# Patient Record
Sex: Female | Born: 1985 | Race: Black or African American | Hispanic: No | Marital: Single
Health system: Southern US, Academic
[De-identification: ages and names within clinical notes are randomized; demographics above are authoritative.]

## PROBLEM LIST (undated history)

## (undated) ENCOUNTER — Ambulatory Visit: Payer: MEDICAID

## (undated) ENCOUNTER — Encounter: Attending: Medical Oncology | Primary: Medical Oncology

## (undated) ENCOUNTER — Telehealth

## (undated) ENCOUNTER — Ambulatory Visit: Payer: PRIVATE HEALTH INSURANCE

## (undated) ENCOUNTER — Encounter

## (undated) ENCOUNTER — Encounter: Attending: Geriatric Medicine | Primary: Geriatric Medicine

## (undated) ENCOUNTER — Ambulatory Visit

## (undated) ENCOUNTER — Telehealth: Attending: Pharmacist | Primary: Pharmacist

## (undated) ENCOUNTER — Telehealth: Attending: Medical Oncology | Primary: Medical Oncology

## (undated) ENCOUNTER — Telehealth: Attending: Adult Health | Primary: Adult Health

## (undated) ENCOUNTER — Encounter: Attending: Adult Health | Primary: Adult Health

## (undated) ENCOUNTER — Ambulatory Visit: Payer: PRIVATE HEALTH INSURANCE | Attending: Medical Oncology | Primary: Medical Oncology

## (undated) ENCOUNTER — Ambulatory Visit: Attending: Adult Health | Primary: Adult Health

## (undated) ENCOUNTER — Inpatient Hospital Stay

## (undated) ENCOUNTER — Encounter: Attending: Oncology | Primary: Oncology

## (undated) ENCOUNTER — Ambulatory Visit: Payer: PRIVATE HEALTH INSURANCE | Attending: Radiation Oncology | Primary: Radiation Oncology

## (undated) ENCOUNTER — Encounter: Attending: Pharmacist | Primary: Pharmacist

## (undated) ENCOUNTER — Ambulatory Visit: Attending: Radiation Oncology | Primary: Radiation Oncology

## (undated) ENCOUNTER — Ambulatory Visit: Payer: MEDICAID | Attending: Medical Oncology | Primary: Medical Oncology

## (undated) ENCOUNTER — Encounter: Attending: MS" | Primary: MS"

## (undated) ENCOUNTER — Ambulatory Visit: Payer: PRIVATE HEALTH INSURANCE | Attending: Family | Primary: Family

## (undated) ENCOUNTER — Ambulatory Visit: Payer: PRIVATE HEALTH INSURANCE | Attending: Critical Care Medicine | Primary: Critical Care Medicine

## (undated) ENCOUNTER — Telehealth: Attending: Critical Care Medicine | Primary: Critical Care Medicine

## (undated) DIAGNOSIS — C50211 Malignant neoplasm of upper-inner quadrant of right female breast: Secondary | ICD-10-CM

## (undated) DIAGNOSIS — Q819 Epidermolysis bullosa, unspecified: Secondary | ICD-10-CM

## (undated) DIAGNOSIS — C50919 Malignant neoplasm of unspecified site of unspecified female breast: Secondary | ICD-10-CM

## (undated) HISTORY — DX: Malignant neoplasm of upper-inner quadrant of right female breast: C50.211

## (undated) HISTORY — DX: Malignant neoplasm of unspecified site of unspecified female breast: C50.919

---
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
## 2012-06-14 ENCOUNTER — Emergency Department (HOSPITAL_COMMUNITY)
Admission: EM | Admit: 2012-06-14 | Discharge: 2012-06-14 | Disposition: A | Discharge status: Home / Self Care | Payer: Self-pay | Attending: Emergency Medicine | Admitting: Emergency Medicine

## 2012-06-14 ENCOUNTER — Encounter (HOSPITAL_COMMUNITY): Payer: Self-pay | Admitting: *Deleted

## 2012-06-14 DIAGNOSIS — H109 Unspecified conjunctivitis: Secondary | ICD-10-CM | POA: Insufficient documentation

## 2012-06-14 DIAGNOSIS — H5789 Other specified disorders of eye and adnexa: Secondary | ICD-10-CM | POA: Insufficient documentation

## 2012-06-14 DIAGNOSIS — Z87798 Personal history of other (corrected) congenital malformations: Secondary | ICD-10-CM | POA: Insufficient documentation

## 2012-06-14 HISTORY — DX: Epidermolysis bullosa, unspecified: Q81.9

## 2012-06-14 MED ORDER — ERYTHROMYCIN 5 MG/GM OP OINT
1.0000 "application " | TOPICAL_OINTMENT | Freq: Once | OPHTHALMIC | Status: AC
  Administered 2012-06-14: 1 via OPHTHALMIC
  Filled 2012-06-14: qty 3.5

## 2012-06-14 NOTE — ED Provider Notes (Signed)
History     CSN: [id number]  Arrival date & time 06/14/12  0157   First MD Initiated Contact with Patient 06/14/12 [phone]      Chief Complaint  Patient presents with  . eye irritation     (Consider location/radiation/quality/duration/timing/severity/associated sxs/prior treatment) HPI  Marisa Hudson Date is a 27 y.o. female complaining of irritation, redness and swelling to lower eyelid on left eye onset yesterday. Patient denies eye pain, decreased vision, trauma to the affected eye. 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 She has woken up with the eyelid crusted shut this a.m. She does not use contact lenses   Past Medical History  Diagnosis Date  . Epidermolysis bullosa     History reviewed. No pertinent past surgical history.  No family history on file.  History  Substance Use Topics  . Smoking status: Never Smoker   . Smokeless tobacco: Not on file  . Alcohol Use: No    OB History   Grav Para Term Preterm Abortions TAB SAB Ect Mult Living                  Review of Systems  Constitutional: Negative for fever.  Eyes: Positive for redness. Negative for photophobia, pain and visual disturbance.  Respiratory: Negative for shortness of breath.   Cardiovascular: Negative for chest pain.  Gastrointestinal: Negative for nausea, vomiting, abdominal pain and diarrhea.  All other systems reviewed and are negative.    Allergies  Review of patient's allergies indicates no known allergies.  Home Medications  No current outpatient prescriptions on file.  BP 145/90  Pulse 75  Temp(Src) 98.4 F (36.9 C) (Oral)  Resp 16  SpO2 100%  LMP 06/09/2012  Physical Exam  Nursing note and vitals reviewed. Constitutional: She is oriented to person, place, and time. She appears well-developed and well-nourished. No distress.  HENT:  Head: Normocephalic.  Mouth/Throat: Oropharynx is clear and moist.  Eyes: EOM are normal. Pupils are equal,  round, and reactive to light.  Injection to right bulbar conjunctiva. Mild swelling to lower lid, purulent discharge.  Neck: Normal range of motion.  Cardiovascular: Normal rate, regular rhythm and intact distal pulses.   Pulmonary/Chest: Effort normal and breath sounds normal. No stridor. No respiratory distress. She has no wheezes. She has no rales. She exhibits no tenderness.  Abdominal: Soft. Bowel sounds are normal. She exhibits no distension and no mass. There is no tenderness. There is no rebound and no guarding.  Musculoskeletal: Normal range of motion.  Lymphadenopathy:    She has no cervical adenopathy.  Neurological: She is alert and oriented to person, place, and time.  Psychiatric: She has a normal mood and affect.    ED Course  Procedures (including critical care time)  Labs Reviewed - No data to display No results found.   1. Conjunctivitis       MDM   Filed Vitals:   06/14/12 0204  BP: 145/90  Pulse: 75  Temp: 98.4 F (36.9 C)  TempSrc: Oral  Resp: 16  SpO2: 100%     Marisa Hudson is a 27 y.o. female infection consistent with a bacterial conjunctivitis. She will be given erythromycin ointment.  Medications  erythromycin ophthalmic ointment 1 application (1 application Right Eye Given 06/14/12 0252)    The patient is hemodynamically stable, appropriate for, and amenable to, discharge at this time. Pt verbalized understanding and agrees with care plan. Outpatient follow-up and return precautions given.  Wynetta Emery, PA-C 06/14/12 [phone]

## 2012-06-14 NOTE — ED Notes (Signed)
Pt states has a skin condition that flares up; states yesterday eye became irritated/red/running; denies pain; states got worse when outside

## 2012-06-15 NOTE — ED Provider Notes (Signed)
Medical screening examination/treatment/procedure(s) were performed by non-physician practitioner and as supervising physician I was immediately available for consultation/collaboration.  Sunnie Nielsen, MD 06/15/12 [phone]

## 2015-02-23 ENCOUNTER — Ambulatory Visit (INDEPENDENT_AMBULATORY_CARE_PROVIDER_SITE_OTHER): Payer: Self-pay | Admitting: Family Medicine

## 2015-02-23 VITALS — BP 138/90 | HR 85 | Temp 98.3°F | Resp 20 | Ht 66.0 in | Wt 212.6 lb

## 2015-02-23 DIAGNOSIS — N6310 Unspecified lump in the right breast, unspecified quadrant: Secondary | ICD-10-CM

## 2015-02-23 DIAGNOSIS — N6452 Nipple discharge: Secondary | ICD-10-CM

## 2015-02-23 DIAGNOSIS — N63 Unspecified lump in breast: Secondary | ICD-10-CM

## 2015-02-23 NOTE — Patient Instructions (Signed)
We will schedule a ultrasound and mammogram as discussed. If you do have increased discomfort, fevers or chills, or other symptoms prior to that ultrasound, return as may need to consider antibiotics.  Once I have the results of the studies, we will let you know the next step.

## 2015-02-23 NOTE — Progress Notes (Signed)
Subjective:  By signing my name below, I, [name], attest that this documentation has been prepared under the direction and in the presence of [name], MD.  [name], Medical Scribe. 02/23/2015.  8:47 AM.   Patient ID: Marisa Hudson, female    DOB: 02/19/1985, 30 y.o.   MRN: [id number]  Chief Complaint  Patient presents with  . Breast Mass    right breast    HPI HPI Comments: Marisa Hudson is a 30 y.o. female who presents to [facility] complaining of right breast mass, first noticed 5 days ago.  Pt reports noticing blood on her T-shirt after itching the breast area. She notes that after she checked the area she noticed discharge from the right nipple, and presence of blisters on the surrounding area which she notes is not abnormal for her. In addition to that, pt noted a mass that is palpated under the breast tissue. She reports no associated tenderness of the area. Pt states having a history of Epidermolysis bullosa that usually presents in blisters, she denies associated pain with it. She does not follow up with a dermatologist for this. Pt reports no history of having a mammogram. She denies previous history of breast lumps or tumors, chills, fevers, unexpected weight loss, menstrual problems. Pt is not sexual active and denies recent pregnancies.   Pt states that her new medical insurance will not start until March 1st.   There are no active problems to display for this patient.  Past Medical History  Diagnosis Date  . Epidermolysis bullosa    No past surgical history on file. No Known Allergies Prior to Admission medications   Not on File   Social History   Social History  . Marital Status: Single    Spouse Name: N/A  . Number of Children: N/A  . Years of Education: N/A   Occupational History  . Not on file.   Social History Main Topics  . Smoking status: Never Smoker   . Smokeless tobacco: Not on file  . Alcohol  Use: No  . Drug Use: Not on file  . Sexual Activity: Not on file   Other Topics Concern  . Not on file   Social History Narrative    Review of Systems  Constitutional: Negative for fever, chills and unexpected weight change.  Genitourinary: Negative for menstrual problem.  Musculoskeletal:       Mass under the right breast tissue.      Objective:   Physical Exam  Constitutional: She is oriented to person, place, and time. She appears well-developed and well-nourished. No distress.  HENT:  Head: Normocephalic and atraumatic.  Eyes: EOM are normal. Pupils are equal, round, and reactive to light.  Neck: Neck supple.  Cardiovascular: Normal rate.   Pulmonary/Chest: Effort normal.  Musculoskeletal:  Healed scars on the upper breast on the right side. Small amount of discharge from the nipple that appears to be clear-yellow in color. On the medial aspect of the right breast at approximately  3 o'clock, there is a firm rounded mass on a firm rounded area that measures approximately 4-5 cm across. Non tender. No other masses palpated. Left breast is normal. No left niple discharge.   Neurological: She is alert and oriented to person, place, and time. No cranial nerve deficit.  Skin: Skin is warm and dry.  Psychiatric: She has a normal mood and affect. Her behavior is normal.  Nursing note and vitals reviewed.  Filed Vitals:   02/23/15 0829  BP: 138/90  Pulse: 85  Temp: 98.3 F (36.8 C)  TempSrc: Oral  Resp: 20  Height: 5\' 6"  (1.676 m)  Weight: 212 lb 9.6 oz (96.435 kg)  SpO2: 94%      Assessment & Plan:   Marisa Hudson is a 31 y.o. female Bloody discharge from right nipple - Plan: US BREAST LTD UNI RIGHT INC AXILLA, MM Digital Diagnostic Bilat  Mass of right breast - Plan: US BREAST LTD UNI RIGHT INC AXILLA, MM Digital Diagnostic Bilat   -New-onset of right breast mass with nipple discharge, bleeding. History of epidermolysis bullosa, but no other new skin changes  seen around the nipple. Less likely Paget's. Possible cyst versus mass. We'll check a diagnostic ultrasound and mammogram, then determine next step. RTC precautions of fever, pain or other worsening symptoms. Deferred antibiotics at this time as nontender and no signs of infection.  No orders of the defined types were placed in this encounter.   Patient Instructions  We will schedule a ultrasound and mammogram as discussed. If you do have increased discomfort, fevers or chills, or other symptoms prior to that ultrasound, return as may need to consider antibiotics.  Once I have the results of the studies, we will let you know the next step.      I personally performed the services described in this documentation, which was scribed in my presence. The recorded information has been reviewed and considered, and addended by me as needed.

## 2015-02-24 ENCOUNTER — Other Ambulatory Visit: Payer: Self-pay | Admitting: Family Medicine

## 2015-02-24 DIAGNOSIS — N6452 Nipple discharge: Secondary | ICD-10-CM

## 2015-02-24 DIAGNOSIS — N63 Unspecified lump in unspecified breast: Secondary | ICD-10-CM

## 2015-03-03 ENCOUNTER — Ambulatory Visit
Admission: RE | Admit: 2015-03-03 | Discharge: 2015-03-03 | Disposition: A | Discharge status: Home / Self Care | Payer: BLUE CROSS/BLUE SHIELD | Source: Ambulatory Visit | Attending: Family Medicine | Admitting: Family Medicine

## 2015-03-03 ENCOUNTER — Other Ambulatory Visit: Payer: Self-pay | Admitting: Family Medicine

## 2015-03-03 DIAGNOSIS — N6310 Unspecified lump in the right breast, unspecified quadrant: Secondary | ICD-10-CM

## 2015-03-03 DIAGNOSIS — N6452 Nipple discharge: Secondary | ICD-10-CM

## 2015-03-03 DIAGNOSIS — N63 Unspecified lump in unspecified breast: Secondary | ICD-10-CM

## 2015-03-05 ENCOUNTER — Telehealth: Payer: Self-pay | Admitting: *Deleted

## 2015-03-05 ENCOUNTER — Encounter: Payer: Self-pay | Admitting: *Deleted

## 2015-03-05 DIAGNOSIS — C50211 Malignant neoplasm of upper-inner quadrant of right female breast: Secondary | ICD-10-CM

## 2015-03-05 HISTORY — DX: Malignant neoplasm of upper-inner quadrant of right female breast: C50.211

## 2015-03-05 NOTE — Telephone Encounter (Signed)
Confirmed [facility] for 03/10/15 at 1215 .  Instructions and contact information given.

## 2015-03-05 NOTE — Telephone Encounter (Signed)
Left vm for pt to return call regarding Marisa Hudson appt on 03/10/15. Contact information provided.

## 2015-03-08 ENCOUNTER — Telehealth: Payer: Self-pay | Admitting: *Deleted

## 2015-03-08 NOTE — Telephone Encounter (Signed)
Mailed clinic packet to pt.  

## 2015-03-10 ENCOUNTER — Ambulatory Visit
Admission: RE | Admit: 2015-03-10 | Discharge: 2015-03-10 | Disposition: A | Discharge status: Home / Self Care | Payer: BLUE CROSS/BLUE SHIELD | Source: Ambulatory Visit | Attending: Radiation Oncology | Admitting: Radiation Oncology

## 2015-03-10 ENCOUNTER — Telehealth: Payer: Self-pay | Admitting: Hematology and Oncology

## 2015-03-10 ENCOUNTER — Encounter: Payer: Self-pay | Admitting: Physical Therapy

## 2015-03-10 ENCOUNTER — Encounter: Payer: Self-pay | Admitting: Nurse Practitioner

## 2015-03-10 ENCOUNTER — Ambulatory Visit: Payer: BLUE CROSS/BLUE SHIELD | Attending: General Surgery | Admitting: Physical Therapy

## 2015-03-10 ENCOUNTER — Other Ambulatory Visit: Payer: Self-pay | Admitting: *Deleted

## 2015-03-10 ENCOUNTER — Encounter: Payer: Self-pay | Admitting: Hematology and Oncology

## 2015-03-10 ENCOUNTER — Ambulatory Visit (HOSPITAL_BASED_OUTPATIENT_CLINIC_OR_DEPARTMENT_OTHER): Payer: BLUE CROSS/BLUE SHIELD | Admitting: Hematology and Oncology

## 2015-03-10 ENCOUNTER — Encounter: Payer: Self-pay | Admitting: Genetic Counselor

## 2015-03-10 ENCOUNTER — Other Ambulatory Visit (HOSPITAL_BASED_OUTPATIENT_CLINIC_OR_DEPARTMENT_OTHER): Payer: BLUE CROSS/BLUE SHIELD

## 2015-03-10 VITALS — BP 147/87 | HR 95 | Temp 98.9°F | Resp 18 | Ht 66.0 in | Wt 205.7 lb

## 2015-03-10 DIAGNOSIS — C50211 Malignant neoplasm of upper-inner quadrant of right female breast: Secondary | ICD-10-CM

## 2015-03-10 DIAGNOSIS — Z8041 Family history of malignant neoplasm of ovary: Secondary | ICD-10-CM

## 2015-03-10 DIAGNOSIS — C773 Secondary and unspecified malignant neoplasm of axilla and upper limb lymph nodes: Secondary | ICD-10-CM | POA: Diagnosis not present

## 2015-03-10 DIAGNOSIS — Q819 Epidermolysis bullosa, unspecified: Secondary | ICD-10-CM | POA: Diagnosis not present

## 2015-03-10 DIAGNOSIS — R293 Abnormal posture: Secondary | ICD-10-CM | POA: Diagnosis present

## 2015-03-10 LAB — COMPREHENSIVE METABOLIC PANEL
ALBUMIN: 3.9 g/dL (ref 3.5–5.0)
ALK PHOS: 61 U/L (ref 40–150)
ALT: 12 U/L (ref 0–55)
AST: 16 U/L (ref 5–34)
Anion Gap: 9 mEq/L (ref 3–11)
BILIRUBIN TOTAL: 0.43 mg/dL (ref 0.20–1.20)
BUN: 6.5 mg/dL — ABNORMAL LOW (ref 7.0–26.0)
CALCIUM: 9.1 mg/dL (ref 8.4–10.4)
CO2: 24 mEq/L (ref 22–29)
Chloride: 107 mEq/L (ref 98–109)
Creatinine: 0.9 mg/dL (ref 0.6–1.1)
GLUCOSE: 99 mg/dL (ref 70–140)
POTASSIUM: 3.9 meq/L (ref 3.5–5.1)
Sodium: 139 mEq/L (ref 136–145)
TOTAL PROTEIN: 7.4 g/dL (ref 6.4–8.3)

## 2015-03-10 LAB — CBC WITH DIFFERENTIAL/PLATELET
BASO%: 0.5 % (ref 0.0–2.0)
BASOS ABS: 0 10*3/uL (ref 0.0–0.1)
EOS ABS: 0 10*3/uL (ref 0.0–0.5)
EOS%: 0.9 % (ref 0.0–7.0)
HEMATOCRIT: 38.2 % (ref 34.8–46.6)
HEMOGLOBIN: 11.7 g/dL (ref 11.6–15.9)
LYMPH#: 1.7 10*3/uL (ref 0.9–3.3)
LYMPH%: 31.9 % (ref 14.0–49.7)
MCH: 21.9 pg — AB (ref 25.1–34.0)
MCHC: 30.6 g/dL — ABNORMAL LOW (ref 31.5–36.0)
MCV: 71.6 fL — AB (ref 79.5–101.0)
MONO#: 0.5 10*3/uL (ref 0.1–0.9)
MONO%: 9.5 % (ref 0.0–14.0)
NEUT%: 57.2 % (ref 38.4–76.8)
NEUTROS ABS: 3 10*3/uL (ref 1.5–6.5)
PLATELETS: 303 10*3/uL (ref 145–400)
RBC: 5.34 10*6/uL (ref 3.70–5.45)
RDW: 13.5 % (ref 11.2–14.5)
WBC: 5.2 10*3/uL (ref 3.9–10.3)

## 2015-03-10 NOTE — Assessment & Plan Note (Signed)
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 After reviewing the pathology in detail, we proceeded to discuss the different treatment options between surgery, radiation, chemotherapy.  Recommendation: 1. Neoadjuvant chemotherapy with dose dense Adriamycin and Cytoxan 4 followed by Abraxane and carboplatin weekly 12 2. Followed by breast conserving surgery and sentinel lymph node biopsy versus axillary lymph node dissection 3. We may consider her for Alliance clinical trial that randomizes to axillary lymph node dissection versus radiation alone 4. Adjuvant radiation therapy   Chemotherapy Counseling: I discussed the risks and benefits of chemotherapy including the risks of nausea/ vomiting, risk of infection from low WBC count, fatigue due to chemo or anemia, bruising or bleeding due to low platelets, mouth sores, loss/ change in taste and decreased appetite. Liver and kidney function will be monitored through out chemotherapy as abnormalities in liver and kidney function may be a side effect of treatment. Cardiac dysfunction due to Adriamycin was discussed in detail. Risk of permanent bone marrow dysfunction and leukemia due to chemo were also discussed.  Plan: 1. MRI of the breast 2. Echocardiogram 3. Port placement 4. Chemotherapy class  Epidermolysis bullosa: We will refer the patient to dermatology to work with [place]  during chemotherapy and later for her extensive skin disease that she has had throughout her life.  Patient would like to have a second opinion regarding the treatment plan as was the diagnosis. I recommended that she see Dr. Wetzel Bjornstad at [facility] for second opinion.  We will plan on chemotherapy once she comes back from her second opinion and makes up her mind regarding chemotherapy.

## 2015-03-10 NOTE — Progress Notes (Signed)
Marisa Hudson is a very pleasant 30 y.o. female from [place], [place] with newly diagnosed invasive ductal carcinoma of the right breast.  Biopsy results revealed the tumor's prognostic profile is ER negative, PR negative, and HER2/neu negative.  She presents today with her mother and her two friends to the [facility]) for treatment consideration and recommendations from the breast surgeon, radiation oncologist, and medical oncologist.     I briefly met with Marisa Hudson and her mother and her two friends during her [facility] visit today. We discussed the purpose of the Survivorship Clinic, which will include monitoring for recurrence, coordinating completion of age and gender-appropriate cancer screenings, promotion of overall wellness, as well as managing potential late/long-term side effects of anti-cancer treatments.    The treatment plan for Marisa Hudson will likely include surgery, chemotherapy, and radiation therapy.  She will meet with the Genetics Counselor due to her age and triple negative status. As of today, the intent of treatment for Marisa Hudson is cure, therefore she will be eligible for the Survivorship Clinic upon her completion of treatment.  Her survivorship care plan (SCP) document will be drafted and updated throughout the course of her treatment trajectory. She will receive the SCP in an office visit with myself in the Survivorship Clinic once she has completed treatment.   Marisa Hudson was encouraged to ask questions and all questions were answered to her satisfaction.  She was given my business card and encouraged to contact me with any concerns regarding survivorship.  I look forward to participating in her care.   Kenn File, [place] [phone]

## 2015-03-10 NOTE — Progress Notes (Signed)
  [place] Psychosocial Distress Screening Clinical Social Work  Clinical Social Work was referred by distress screening protocol.  The patient scored a 2 on the Psychosocial Distress Thermometer which indicates Mild distress. Counseling Intern to assess for distress and other psychosocial needs.   ONCBCN DISTRESS SCREENING 03/10/2015  Screening Type Initial Screening  Distress experienced in past week (1-10) 2  Information Concerns Type Lack of info about diagnosis  Referral to support programs Yes   Counseling intern note: Met with Pt, mother, and 2 friends during [facility]. Pt reported a low level of distress and stated that her faith, family and friends keep her feeling optimistic. Pt stated that she wishes her care team knew more about her skin condition (lack of info about her dx is regarding her tx team not knowing about her skin condition).  Pt stated that clinic was helpful and very informative.  Client stressed that her faith is very important to her.  Counseling Intern provided information about supportive services available in the support center.  Client stated that she would reach out as needed.  No support center follow up indicated at this time.  -Vaughan Sine Counseling Intern  [phone]

## 2015-03-10 NOTE — Therapy (Signed)
[place], [place], [address] Phone: [phone]   Fax:  [phone]  Physical Therapy Evaluation  Patient Details  Name: Marisa Hudson MRN: [id number] Date of Birth: 01-10-1985 Referring Provider: Dr. Rolm Bookbinder  Encounter Date: 03/10/2015      PT End of Session - 03/10/15 1557    Visit Number 1   Number of Visits 1   PT Start Time 1430   PT Stop Time 1444  Also saw pt from 1455-1512 for a total of 31 minutes   PT Time Calculation (min) 14 min   Activity Tolerance Patient tolerated treatment well   Behavior During Therapy [facility] for tasks assessed/performed      Past Medical History  Diagnosis Date  . Epidermolysis bullosa   . Breast cancer of upper-inner quadrant of right female breast ([place]) 03/05/2015  . Breast cancer ([place])     History reviewed. No pertinent past surgical history.  There were no vitals filed for this visit.  Visit Diagnosis:  Carcinoma of right breast upper inner quadrant ([place]) - Plan: PT plan of care cert/re-cert  Abnormal posture - Plan: PT plan of care cert/re-cert      Subjective Assessment - 03/10/15 1532    Subjective Patient reports she was diagnosed with breast cancer.  She reports she has a skin condition that effects her inside and outside her body and questions whether this diagnosis of breast cancer is actually breast cancer or possibly something else related to her skin condition.  She is seeking a second opinion at [facility].  She was seen today for a baseline assessment of her newly diagnosed breast cancer.   Pertinent History Patient was diagnosed on 03/03/15 with right triple negative grade 3 breast cancer.  It has a Ki67 of 80%, measures 4.4 cm, is located in the upper inner quadrant, and has a biopsy proven positive lymph node.  She reports she has a skin condition that effects her inside and outside her body and questions whether this diagnosis  of breast cancer is actually breast cancer or possibly something else related to her skin condition.  She is seeking a second opinion at [facility].     Patient Stated Goals Reduce lymphedema risk and learn post op shoulder ROM HEP "If I actually have breast cancer and have surgery"   Currently in Pain? No/denies            [facility] PT Assessment - 03/10/15 0001    Assessment   Medical Diagnosis Right breast cancer; Epidermolysis bullosa   Referring Provider Dr. Rolm Bookbinder   Onset Date/Surgical Date 03/03/15   Hand Dominance Left   Prior Therapy none   Precautions   Precautions Other (comment)   Precaution Comments Active breast cancer;    Restrictions   Weight Bearing Restrictions No   Balance Screen   Has the patient fallen in the past 6 months No   Has the patient had a decrease in activity level because of a fear of falling?  No   Is the patient reluctant to leave their home because of a fear of falling?  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   Leisure She does not exercise   Cognition   Overall Cognitive Status Within Functional Limits for tasks assessed   Posture/Postural Control   Posture/Postural Control Postural limitations   Postural Limitations Rounded Shoulders;Forward head   ROM / Strength   AROM / PROM / Strength AROM;Strength   AROM   AROM Assessment Site Shoulder   Right/Left Shoulder Right;Left   Right Shoulder Extension 55 Degrees   Right Shoulder Flexion 153 Degrees   Right Shoulder ABduction 141 Degrees   Right Shoulder Internal Rotation 80 Degrees   Right Shoulder External Rotation 79 Degrees   Left Shoulder Extension 69 Degrees    Left Shoulder Flexion 161 Degrees   Left Shoulder ABduction 151 Degrees   Left Shoulder Internal Rotation 80 Degrees   Left Shoulder External Rotation 86 Degrees   Strength   Overall Strength Within functional limits for tasks performed           LYMPHEDEMA/ONCOLOGY QUESTIONNAIRE - 03/10/15 1555    Type   Cancer Type Right breast cancer   Lymphedema Assessments   Lymphedema Assessments Upper extremities   Right Upper Extremity Lymphedema   10 cm Proximal to Olecranon Process 33.9 cm   Olecranon Process 26.5 cm   10 cm Proximal to Ulnar Styloid Process 23.2 cm   Just Proximal to Ulnar Styloid Process 15.5 cm   Across Hand at [employer] 18.7 cm   At [place] of 2nd Digit 6.1 cm   Left Upper Extremity Lymphedema   10 cm Proximal to Olecranon Process 34.3 cm   Olecranon Process 26.4 cm   10 cm Proximal to Ulnar Styloid Process 22.7 cm   Just Proximal to Ulnar Styloid Process 15.8 cm   Across Hand at [employer] 19.1 cm   At [place] of 2nd Digit 6 cm        Patient was instructed today in a home exercise program today for post op shoulder range of motion. These included active assist shoulder flexion in sitting, scapular retraction, wall walking with shoulder abduction, and hands behind head external rotation.  She was encouraged to do these twice a day, holding 3 seconds and repeating 5 times when permitted by her physician.         PT Education - 03/10/15 1557    Education provided Yes   Education Details Lymphedema risk reduction and post op shoulder ROM HEP   Person(s) Educated Patient;Caregiver(s);Parent(s)   Methods Explanation;Demonstration;Handout   Comprehension Returned demonstration;Verbalized understanding              Breast Clinic Goals - 03/10/15 1601    Patient will be able to verbalize understanding of pertinent lymphedema risk reduction practices relevant to her diagnosis specifically related to skin care.   Time 1   Period Days   Status  Achieved   Patient will be able to return demonstrate and/or verbalize understanding of the post-op home exercise program related to regaining shoulder range of motion.   Time 1   Period Days   Status Achieved   Patient will be able to verbalize understanding of the importance of attending the postoperative After Breast Cancer Class for further lymphedema risk reduction education and therapeutic exercise.   Time 1   Period Days   Status Achieved              Plan - 03/10/15 1558    Clinical Impression Statement Patient was diagnosed on 03/03/15 with right triple negative grade 3 breast cancer.  It has a Ki67 of 80%, measures 4.4 cm, is located in the upper inner quadrant, and has a biopsy proven positive lymph node.  She reports she has a skin condition that effects her inside and outside her body and questions whether this diagnosis of breast cancer is actually breast cancer or possibly something else related to her skin condition.  She is seeking a second opinion at [facility].  Her recommended treatment plan is neoadjuvant chemotherapy followed by a right lumpectomy or mastectomy with either a sentinel node biopsy or axillary lymph node dissection followed by radiation.  She will benefit from PT after surgery to regain shoulder ROM and reduce lymphedema risk as she will likely be at high risk.    Pt will benefit from skilled therapeutic intervention in order to improve on the following deficits Decreased strength;Pain;Decreased knowledge of precautions;Impaired UE functional use;Decreased range of motion   Rehab Potential Excellent   Clinical Impairments Affecting Rehab Potential none   PT Frequency One time visit   PT Treatment/Interventions Therapeutic exercise;Patient/family education   PT Next Visit Plan Will f/u after surgery if pt consents to surgery   PT Home Exercise Plan Post op shoulder ROM HEP   Consulted and Agree with Plan of Care Patient;Family member/caregiver    Family Member Consulted Mother       Patient will follow up at outpatient cancer rehab if needed following surgery.  If the patient requires physical therapy at that time, a specific plan will be dictated and sent to the referring physician for approval. The patient was educated today on appropriate basic range of motion exercises to begin post operatively and the importance of attending the After Breast Cancer class following surgery.  Patient was educated today on lymphedema risk reduction practices as it pertains to recommendations that will benefit the patient immediately following surgery.  She verbalized good understanding.  No additional physical therapy is indicated at this time.      Problem List Patient Active Problem List   Diagnosis Date Noted  . Breast cancer of upper-inner quadrant of right female breast ([place]) 03/05/2015    Annia Friendly, PT 03/10/2015 4:03 PM  [place], [place], [address] Phone: [phone]   Fax:  [phone]  Name: Marisa Hudson MRN: [id number] Date of Birth: 04/04/1985

## 2015-03-10 NOTE — Progress Notes (Signed)
Radiation Oncology         [phone] ________________________________  Name: Marisa Hudson MRN: [id number]  Date: 03/10/2015  DOB: 12/26/1985  MC:NOBSJG,GEZMOQH R, MD  Rolm Bookbinder, MD     REFERRING PHYSICIAN: Rolm Bookbinder, MD   DIAGNOSIS: The encounter diagnosis was Breast cancer of upper-inner quadrant of right female breast [facility]).  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   HISTORY OF PRESENT ILLNESS:Marisa Hudson is a 30 y.o. female who is seen for an initial consultation visit. She went to [facility] complaining of right breast mass that she noticed a couple days prior. She mentions that she noticed blood on her T-shirt after itching the breast area. She also noted some discharge from the nipple along with some blisters on the surrounding area. She noted a mass on a self-breast exam. She presented to Dr. Carlota Raspberry, her Primary Care Physician, 02/23/2015. She had a mammogram 03/03/2015, revealing a mass in the upper inner quadrant of the right breast approximately at the 2:30 o'clock position 5 cm from the nipple. It measured about 5 cm in size. 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  She comes today for further discussion on the role of radiotherapy as a part of her cancer care with Dr. Lisbeth Renshaw.   PREVIOUS RADIATION THERAPY: No   PAST MEDICAL HISTORY:  Past Medical History  Diagnosis Date  . Epidermolysis bullosa   . Breast cancer of upper-inner quadrant of right female breast ([place]) 03/05/2015  . Breast cancer ([place])     PAST SURGICAL HISTORY:No past surgical history on file.   FAMILY HISTORY:  Family History  Problem Relation Age of Onset  . Ovarian cancer Maternal  Grandmother     SOCIAL HISTORY:  reports that she has never smoked. She does not have any smokeless tobacco history on file. She reports that she does not drink alcohol or use illicit drugs. She lives in [place]. She is single. 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    ALLERGIES: Review of patient's allergies indicates no known allergies.   MEDICATIONS:  No current outpatient prescriptions on file.   No current facility-administered medications for this encounter.     REVIEW OF SYSTEMS:  On review of systems, the patient reports that she has had skin issues her entire life. She avoids sweets in her diet to prevent breakouts. She has used silvadene cream if she develops blistering of the skin. She denies any chest pain, shortness of breath, fevers or chills. She denies any unintended weight changes. She has not noticed any additional nipple discharge or bleeding. She reports normal bowel and bladder activity. A complete review of systems is obtained and is otherwise negative.    PHYSICAL EXAM:  BP 147/87 P 95 RR 18 Pulse ox 100% RA Pain Scale 0/10 In general this is a well appearing African American female in no acute distress. She is alert and oriented x4 and appropriate throughout the examination. HEENT reveals that the patient is normocephalic, atraumatic. EOMs are intact. PERRLA. Skin is intact without any evidence of gross lesions, though hyperpigmentation is noted in general. Cardiovascular exam reveals a regular rate and rhythm, no clicks rubs or murmurs are auscultated. Chest is clear to  auscultation bilaterally. Lymphatic assessment is performed and does not reveal any adenopathy in the cervical, supraclavicular chains. Along the right axilla, there is fullness along the anterior axillary lymph node chain consistent with her known metastatic disease. Bilateral breast exam is performed and the left breast does not reveal any palpable abnormalities, no nipple  discharge or bleeding is present. Along the right there is some skin avulsion and blistering of the skin where her previous biopsy dressing was placed. No nipple discharge is present. There is palpable fullness in the medial right breast consistent with her known tumor. This is nontender but is somewhat fixed. Abdomen has active bowel sounds in all quadrants and is intact. The abdomen is soft, non tender, non distended. Lower extremities are negative for pretibial pitting edema, deep calf tenderness, cyanosis or clubbing.   Photo orientation: right medial breast with skin evulsion/blistering at 2 o'clock and 4 o'clock from previous adhesive tape  ECOG = 100  0 - Asymptomatic (Fully active, able to carry on all predisease activities without restriction)  1 - Symptomatic but completely ambulatory (Restricted in physically strenuous activity but ambulatory and able to carry out work of a light or sedentary nature. For example, light housework, office work)  2 - Symptomatic, <50% in bed during the day (Ambulatory and capable of all self care but unable to carry out any work activities. Up and about more than 50% of waking hours)  3 - Symptomatic, >50% in bed, but not bedbound (Capable of only limited self-care, confined to bed or chair 50% or more of waking hours)  4 - Bedbound (Completely disabled. Cannot carry on any self-care. Totally confined to bed or chair)  5 - Death   Eustace Pen MM, Creech RH, Tormey DC, et al. [phone]). "Toxicity and response criteria of the [facility] [employer]". [place] Oncol. 5 (6): 649-55   LABORATORY DATA:  Lab Results  Component Value Date   WBC 5.2 03/10/2015   HGB 11.7 03/10/2015   HCT 38.2 03/10/2015   MCV 71.6* 03/10/2015   PLT 303 03/10/2015   Lab Results  Component Value Date   NA 139 03/10/2015   K 3.9 03/10/2015   CO2 24 03/10/2015   Lab Results  Component Value Date   ALT 12 03/10/2015   AST 16 03/10/2015   ALKPHOS 61 03/10/2015    BILITOT 0.43 03/10/2015      RADIOGRAPHY: Mm Digital Diagnostic Unilat R  03/03/2015  CLINICAL DATA:  Post right breast and right axillary lymph node ultrasound-guided core biopsies. EXAM: DIAGNOSTIC RIGHT MAMMOGRAM POST ULTRASOUND BIOPSY COMPARISON:  Previous exam(s). FINDINGS: Mammographic images were obtained following ultrasound guided biopsy of the right breast mass located the 2:30 o'clock position and the enlarged right axillary lymph node. The ribbon shaped clip associated with the right breast mass located at the 2:30 o'clock position is in appropriate position. The spiral shaped clip associated with the enlarged right axillary lymph node also is in appropriate position as viewed in the MLO projection. IMPRESSION: Appropriate positioning of clips following right breast and right axillary lymph node ultrasound-guided core biopsies. Final Assessment: Post Procedure Mammograms for Marker Placement Electronically Signed   By: Altamese Cabal M.D.   On: 03/03/2015 14:15   US Breast Ltd Uni Right Inc Axilla  03/04/2015  CLINICAL DATA:  Patient notes a palpable mass within the right breast for the past several weeks. There is no family history of breast cancer. However, there is a family history of ovarian cancer in  her maternal grandmother. EXAM: DIGITAL DIAGNOSTIC BILATERAL MAMMOGRAM WITH 3D TOMOSYNTHESIS WITH CAD ULTRASOUND RIGHT BREAST COMPARISON:  None ACR Breast Density Category c: The breast tissue is heterogeneously dense, which may obscure small masses. FINDINGS: There is an ill-defined mass located within the upper inner quadrant of the right breast at approximately the 2:30 o'clock position 5 cm from the nipple. By mammography this measures approximately 5 cm in size. Mammographic images were processed with CAD. On physical exam, there is a firm palpable mass located within the right breast at the 2:30 o'clock position 5 cm from the nipple. By physical examination this measures approximately  5 cm in size. There is no palpable right axillary adenopathy. Targeted ultrasound is performed, showing an irregularly marginated hypoechoic mass with increased vascular flow located within the right breast at the 2:30 o'clock position 5 cm from the nipple corresponding to the mammographic finding. This measures 4.4 x 3.1 x 3.6 cm in size. This is worrisome for invasive mammary carcinoma. Ultrasound of the right axilla demonstrates an enlarged abnormal appearing right axillary lymph node with loss of the normal fatty hilum. This measures 3.7 x 2.5 x 1.7 cm in size. Tissue sampling of the right breast mass in the enlarged right axillary lymph node is recommended. This will be performed and reported separately. IMPRESSION: 4.4 cm irregular mass located within the right breast at the 2:30 o'clock position with enlarged right axillary lymph node. Findings are worrisome for invasive mammary carcinoma with a metastatic lymph node. RECOMMENDATION: Right breast and axillary lymph node ultrasound-guided core biopsies. I have discussed the findings and recommendations with the patient. Results were also provided in writing at the conclusion of the visit. If applicable, a reminder letter will be sent to the patient regarding the next appointment. BI-RADS CATEGORY  5: Highly suggestive of malignancy. Electronically Signed   By: Altamese Cabal M.D.   On: 03/03/2015 08:30   Mm Diag Breast Tomo Bilateral  03/03/2015  CLINICAL DATA:  Patient notes a palpable mass within the right breast for the past several weeks. There is no family history of breast cancer. However, there is a family history of ovarian cancer in her maternal grandmother. EXAM: DIGITAL DIAGNOSTIC BILATERAL MAMMOGRAM WITH 3D TOMOSYNTHESIS WITH CAD ULTRASOUND RIGHT BREAST COMPARISON:  None ACR Breast Density Category c: The breast tissue is heterogeneously dense, which may obscure small masses. FINDINGS: There is an ill-defined mass located within the upper  inner quadrant of the right breast at approximately the 2:30 o'clock position 5 cm from the nipple. By mammography this measures approximately 5 cm in size. Mammographic images were processed with CAD. On physical exam, there is a firm palpable mass located within the right breast at the 2:30 o'clock position 5 cm from the nipple. By physical examination this measures approximately 5 cm in size. There is no palpable right axillary adenopathy. Targeted ultrasound is performed, showing an irregularly marginated hypoechoic mass with increased vascular flow located within the right breast at the 2:30 o'clock position 5 cm from the nipple corresponding to the mammographic finding. This measures 4.4 x 3.1 x 3.6 cm in size. This is worrisome for invasive mammary carcinoma. Ultrasound of the right axilla demonstrates an enlarged abnormal appearing right axillary lymph node with loss of the normal fatty hilum. This measures 3.7 x 2.5 x 1.7 cm in size. Tissue sampling of the right breast mass in the enlarged right axillary lymph node is recommended. This will be performed and reported separately. IMPRESSION: 4.4 cm irregular mass  located within the right breast at the 2:30 o'clock position with enlarged right axillary lymph node. Findings are worrisome for invasive mammary carcinoma with a metastatic lymph node. RECOMMENDATION: Right breast and axillary lymph node ultrasound-guided core biopsies. I have discussed the findings and recommendations with the patient. Results were also provided in writing at the conclusion of the visit. If applicable, a reminder letter will be sent to the patient regarding the next appointment. BI-RADS CATEGORY  5: Highly suggestive of malignancy. Electronically Signed   By: Altamese Cabal M.D.   On: 03/03/2015 08:30   [place] Rt Breast Bx W Loc Dev 1st Lesion Img Bx Spec US Guide  03/08/2015  ADDENDUM REPORT: 03/04/2015 14:38 ADDENDUM: Pathology revealed GRADE III INVASIVE DUCTAL CARCINOMA of the  Right breast at the 2:30 o'clock location. LYMPH NODE POSITIVE FOR METASTATIC DUCTAL CARCINOMA of the Right axilla. This was found to be concordant by Dr. Luberta Robertson. Pathology results were discussed with the patient by telephone. The patient reported tenderness and is doing well after the biopsies. Post biopsy instructions and care were reviewed and questions were answered. The patient was encouraged to call [facility] for any additional concerns. The patient was referred to the [facility] at the [facility] on March 10, 2015. Pathology results reported by Susa Raring RN, BSN on 03/04/2015. Electronically Signed   By: Altamese Cabal M.D.   On: 03/04/2015 14:38  03/08/2015  CLINICAL DATA:  Right breast mass with abnormal appearing right axillary lymph node. EXAM: ULTRASOUND GUIDED RIGHT BREAST CORE NEEDLE BIOPSY COMPARISON:  Previous exam(s). FINDINGS: I met with the patient and we discussed the procedure of ultrasound-guided biopsy, including benefits and alternatives. We discussed the high likelihood of a successful procedure. We discussed the risks of the procedure, including infection, bleeding, tissue injury, clip migration, and inadequate sampling. Informed written consent was given. The usual time-out protocol was performed immediately prior to the procedure. Using sterile technique and 1% Lidocaine as local anesthetic, under direct ultrasound visualization, a 14 gauge spring-loaded device was used to perform biopsy of the mass located within the right breast at the 2:30 o'clock position using a medial approach. At the conclusion of the procedure a ribbon shaped tissue marker clip was deployed into the biopsy cavity. Follow up 2 view mammogram was performed and dictated separately. IMPRESSION: Ultrasound guided biopsy of the right breast mass located the 2:30 o'clock position as discussed above. No apparent complications.  Electronically Signed: By: Altamese Cabal M.D. On: 03/03/2015 14:11   [place] Rt Breast Bx W Loc Dev Ea Add Lesion Img Bx Spec US Guide  03/08/2015  ADDENDUM REPORT: 03/04/2015 14:39 ADDENDUM: Pathology revealed GRADE III INVASIVE DUCTAL CARCINOMA of the Right breast at the 2:30 o'clock location. LYMPH NODE POSITIVE FOR METASTATIC DUCTAL CARCINOMA of the Right axilla. This was found to be concordant by Dr. Luberta Robertson. Pathology results were discussed with the patient by telephone. The patient reported tenderness and is doing well after the biopsies. Post biopsy instructions and care were reviewed and questions were answered. The patient was encouraged to call [facility] for any additional concerns. The patient was referred to the [facility] at the [facility] on March 10, 2015. Pathology results reported by Susa Raring RN, BSN on 03/04/2015. Electronically Signed   By: Altamese Cabal M.D.   On: 03/04/2015 14:39  03/08/2015  CLINICAL DATA:  Right breast  mass with enlarged right axillary lymph node. EXAM: ULTRASOUND GUIDED RIGHT AXILLARY LYMPH NODE CORE BIOPSY COMPARISON:  Previous exam(s). FINDINGS: I met with the patient and we discussed the procedure of ultrasound-guided biopsy, including benefits and alternatives. We discussed the high likelihood of a successful procedure. We discussed the risks of the procedure, including infection, bleeding, tissue injury, clip migration, and inadequate sampling. Informed written consent was given. The usual time-out protocol was performed immediately prior to the procedure. Using sterile technique and 1% Lidocaine as local anesthetic, under direct ultrasound visualization, a 14 gauge spring-loaded device was used to perform biopsy of the enlarged right axillary lymph node using a lateral approach. At the conclusion of the procedure a spiral shaped Hydro[employer] tissue marker clip was deployed into the  biopsy cavity. Follow up 2 view mammogram was performed and dictated separately. IMPRESSION: Ultrasound guided biopsy of the enlarged right axillary lymph node as discussed above. No apparent complications. Electronically Signed: By: Altamese Cabal M.D. On: 03/03/2015 14:13     IMPRESSION: Marisa Hudson is a 30 yo female with triple negative grade III invasive ductal carcinoma of the right breast.    PLAN: Dr. Lisbeth Renshaw has discussed the findings from her biopsy, imaging studies, and the general consensus of her overall treatment plan discussed in today's conference. He discusses that the recommendation was for neoadjuvant chemotherapy with Dr. Lindi Adie, genetic testing, followed by interval cytoreductive surgery with Dr. Donne Hazel including axillary dissection, and subsequent radiotherapy with Dr. Lisbeth Renshaw. We discussed that we would plan to see her back once her neoadjuvant treatment and surgery has been completed. We discussed a 33 fractions course of treatment to the right breast. The risks, benefits, short and long-term side effects associated with radiotherapy were all addressed. At the end of the conversation the patient would like to move forward when clinically appropriate, and all of her questions were answered to her satisfaction.  The above documentation reflects my direct findings during this shared patient visit. Please see the separate note by Dr. Lisbeth Renshaw on this date for the remainder of the patient's plan of care.  Carola Rhine, PAC     This document serves as a record of services personally performed by Kyung Rudd, MD. It was created on his behalf by  Lendon Collar, a trained medical scribe. The creation of this record is based on the scribe's personal observations and the provider's statements to them. This document has been checked and approved by the attending provider.

## 2015-03-10 NOTE — Telephone Encounter (Signed)
Faxed medical records to Dr. Wetzel Bjornstad office @ [phone](fax)/[phone](office).

## 2015-03-10 NOTE — Patient Instructions (Signed)

## 2015-03-10 NOTE — Progress Notes (Signed)
[facility] [place] NOTE  Patient Care Team: Wendie Agreste, MD as PCP - General (Family Medicine) Rolm Bookbinder, MD as Consulting Physician (General Surgery) Nicholas Lose, MD as Consulting Physician (Hematology and Oncology) Kyung Rudd, MD as Consulting Physician (Radiation Oncology)  CHIEF COMPLAINTS/PURPOSE OF CONSULTATION:  Newly diagnosed breast cancer  HISTORY OF PRESENTING ILLNESS:  Marisa Hudson 30 y.o. female is here because of recent diagnosis of right breast cancer. Patient had a palpable lump in the right breast for the past 3-4 months. She also had some nipple discharge which was bloody in nature. She underwent a mammogram on 03/03/2015 that revealed a very large mass measuring 4.4 cm by ultrasound. 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 She also had a enlarged right axillary lymph node which was biopsy proven to be invasive ductal carcinoma measuring 3.7 cm. She was presented this morning at the multidisciplinary tumor board and she is here today at the multidisciplinary clinic to discuss the treatment plan. She is accompanied by her mom as well as her friend. 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 I decided to the biopsy where the biopsy tape was placed, there appears to be skin excoriation. she currently works at [employer].  I reviewed her records extensively and collaborated the history with the patient.  SUMMARY OF ONCOLOGIC HISTORY:   Breast cancer of upper-inner quadrant of right female breast ([place])   03/03/2015 Mammogram 4.4 cm irregular mass in the right breast at 2:30 position with enlarged right axillary left normal measuring 3.7 cm, T2 N1 stage II B clinical stage   03/03/2015 Initial Diagnosis Right breast biopsy 2:30 position: Invasive ductal carcinoma ER PR 0%, Ki-67 90%, HER-2 negative ratio  1.37; right axillary lymph node positive for metastatic IDC ER/PR 0% Ki-67 70%, HER-2 negative ratio 1.18    In terms of breast cancer risk profile:  She menarched at early age of 31  She had no children She has not received birth control pills.  She was never exposed to fertility medications or hormone replacement therapy.  She has  family history of Breast/GYN/GI cancer Grandmother had ovarian cancer  MEDICAL HISTORY:  Past Medical History  Diagnosis Date  . Epidermolysis bullosa   . Breast cancer of upper-inner quadrant of right female breast ([place]) 03/05/2015  . Breast cancer ([place])     SURGICAL HISTORY: History reviewed. No pertinent past surgical history.  SOCIAL HISTORY: Social History   Social History  . Marital Status: Single    Spouse Name: N/A  . Number of Children: N/A  . Years of Education: N/A   Occupational History  . Not on file.   Social History Main Topics  . Smoking status: Never Smoker   . Smokeless tobacco: Not on file  . Alcohol Use: No  . Drug Use: No  . Sexual Activity: Not on file   Other Topics Concern  . Not on file   Social History Narrative    FAMILY HISTORY: Family History  Problem Relation Age of Onset  . Ovarian cancer Maternal Grandmother     ALLERGIES:  has No Known Allergies.  MEDICATIONS:  No current outpatient prescriptions on file.   No current facility-administered medications for this visit.    REVIEW OF SYSTEMS:   Constitutional: Denies fevers, chills or abnormal night sweats Eyes: Denies blurriness of vision, double vision or watery  eyes Ears, nose, mouth, throat, and face: Denies mucositis or sore throat Respiratory: Denies cough, dyspnea or wheezes Cardiovascular: Denies palpitation, chest discomfort or lower extremity swelling Gastrointestinal:  Denies nausea, heartburn or change in bowel habits Skin: extensive skin excoriation discoloration related to epidermolysis bullosa, patient is prone for bullous  changes to the skin. Lymphatics: Denies new lymphadenopathy or easy bruising Neurological:Denies numbness, tingling or new weaknesses Behavioral/Psych: Mood is stable, no new changes  Breast: large palpable mass in the right breast and right axilla All other systems were reviewed with the patient and are negative.  PHYSICAL EXAMINATION: ECOG PERFORMANCE STATUS: 1 - Symptomatic but completely ambulatory  Filed Vitals:   03/10/15 1310  BP: 147/87  Pulse: 95  Temp: 98.9 F (37.2 C)  Resp: 18   Filed Weights   03/10/15 1310  Weight: 205 lb 11.2 oz (93.305 kg)    GENERAL:alert, no distress and comfortable SKIN: skin color, texture, turgor are normal, no rashes or significant lesions EYES: normal, conjunctiva are pink and non-injected, sclera clear OROPHARYNX:no exudate, no erythema and lips, buccal mucosa, and tongue normal  NECK: supple, thyroid normal size, non-tender, without nodularity LYMPH:  no palpable lymphadenopathy in the cervical, axillary or inguinal LUNGS: clear to auscultation and percussion with normal breathing effort HEART: regular rate & rhythm and no murmurs and no lower extremity edema ABDOMEN:abdomen soft, non-tender and normal bowel sounds Musculoskeletal:no cyanosis of digits and no clubbing  PSYCH: alert & oriented x 3 with fluent speech NEURO: no focal motor/sensory deficits BREAST:large palpable mass in the right breast at least 4 cm to clinical exam along with 3-4 cm size right axillary lymph node (exam performed in the presence of a chaperone)   LABORATORY DATA:  I have reviewed the data as listed Lab Results  Component Value Date   WBC 5.2 03/10/2015   HGB 11.7 03/10/2015   HCT 38.2 03/10/2015   MCV 71.6* 03/10/2015   PLT 303 03/10/2015   Lab Results  Component Value Date   NA 139 03/10/2015   K 3.9 03/10/2015   CO2 24 03/10/2015    RADIOGRAPHIC STUDIES: I have personally reviewed the radiological reports and agreed with the findings in  the report.  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 ([place]) Right breast biopsy 2:30 position: Invasive ductal carcinoma ER PR 0%, Ki-67 90%, HER-2 negative ratio 1.37; right axillary lymph node positive for metastatic IDC ER/PR 0% Ki-67 70%, HER-2 negative ratio 1.18 Mammogram and ultrasound 03/03/2015:4.4 cm irregular mass in the right breast at 2:30 position with enlarged right axillary left normal measuring 3.7 cm, T2 N1 stage II B clinical stage  Pathology and radiology counseling:Discussed with the patient, the details of pathology including the type of breast cancer,the clinical staging, the significance of ER, PR and HER-2/neu receptors and the implications for treatment. After reviewing the pathology in detail, we proceeded to discuss the different treatment options between surgery, radiation, chemotherapy.  Recommendation: 1. Neoadjuvant chemotherapy with dose dense Adriamycin and Cytoxan 4 followed by Abraxane and carboplatin weekly 12 2. Followed by breast conserving surgery and sentinel lymph node biopsy versus axillary lymph node dissection 3. We may consider her for [facility] clinical trial that randomizes to axillary lymph node dissection versus radiation alone 4. Adjuvant radiation therapy   Chemotherapy Counseling: I discussed the risks and benefits of chemotherapy including the risks of nausea/ vomiting, risk of infection from low WBC count, fatigue due to chemo or anemia, bruising or bleeding  due to low platelets, mouth sores, loss/ change in taste and decreased appetite. Liver and kidney function will be monitored through out chemotherapy as abnormalities in liver and kidney function may be a side effect of treatment. Cardiac dysfunction due to Adriamycin was discussed in detail. Risk of permanent bone marrow dysfunction and leukemia due to chemo were also discussed.  Plan: 1. MRI of the breast 2. Echocardiogram 3. Port  placement 4. Chemotherapy class 5. Genetic counseling  Epidermolysis bullosa: We will refer the patient to dermatology to work with [place] during chemotherapy and later for her extensive skin disease that she has had throughout her life.  Patient would like to have a second opinion regarding the treatment plan as was the diagnosis. I recommended that she see Dr. Wetzel Bjornstad at [facility] for second opinion.  We will plan on chemotherapy once she comes back from her second opinion and makes up her mind regarding chemotherapy.  All questions were answered. The patient knows to call the clinic with any problems, questions or concerns.    Rulon Eisenmenger, MD 03/10/2015

## 2015-03-11 ENCOUNTER — Telehealth: Payer: Self-pay | Admitting: Hematology and Oncology

## 2015-03-11 ENCOUNTER — Ambulatory Visit
Admission: RE | Admit: 2015-03-11 | Discharge: 2015-03-11 | Disposition: A | Discharge status: Home / Self Care | Payer: BLUE CROSS/BLUE SHIELD | Source: Ambulatory Visit | Attending: Hematology and Oncology | Admitting: Hematology and Oncology

## 2015-03-11 ENCOUNTER — Other Ambulatory Visit: Payer: BLUE CROSS/BLUE SHIELD

## 2015-03-11 DIAGNOSIS — C50211 Malignant neoplasm of upper-inner quadrant of right female breast: Secondary | ICD-10-CM

## 2015-03-11 MED ORDER — GADOBENATE DIMEGLUMINE 529 MG/ML IV SOLN
19.0000 mL | Freq: Once | INTRAVENOUS | Status: AC | PRN
  Administered 2015-03-11: 19 mL via INTRAVENOUS

## 2015-03-11 NOTE — Telephone Encounter (Signed)
Gave patient appt for [facility].

## 2015-03-11 NOTE — Telephone Encounter (Signed)
Patient scheduled to see Dr. Camillo Flaming @ [facility] 04/04 @ 11:10.

## 2015-03-12 ENCOUNTER — Other Ambulatory Visit: Payer: Self-pay | Admitting: Hematology and Oncology

## 2015-03-12 DIAGNOSIS — C50211 Malignant neoplasm of upper-inner quadrant of right female breast: Secondary | ICD-10-CM

## 2015-03-16 ENCOUNTER — Telehealth: Payer: Self-pay | Admitting: *Deleted

## 2015-03-16 ENCOUNTER — Other Ambulatory Visit: Payer: Self-pay | Admitting: General Surgery

## 2015-03-16 NOTE — Telephone Encounter (Signed)
Spoke to pt concerning [facility] from 03/10/15. Denies questions or concerns regarding dx or treatment care plan,. Confirmed future appts including 2nd opinion at [facility] with Dr. Wetzel Bjornstad on 03/22/15. After some discussion pt agreeable to have port placement. Relate she is going to [facility] for more information and that she doesn't "think I'll get treatment up there b/c it's so far away". Encourage pt to call with needs. Received verbal understanding.

## 2015-03-22 MED ORDER — CEFAZOLIN SODIUM-DEXTROSE 2-3 GM-% IV SOLR: 2.0000 g | INTRAVENOUS | Status: DC

## 2015-03-22 NOTE — Progress Notes (Signed)
Finally got in touch with pt for pre-op call after several attempts today. She states she has just left [facility] and that they will be putting in her port-a-cath next week and she is cancelling her surgery tomorrow. I told her that she needed to call the office and try to get in touch with Dr. Donne Hazel.   I have spoken with Dr. Brantley Stage (surgeon on call) and he will notify Dr. Donne Hazel.

## 2015-03-23 ENCOUNTER — Encounter (HOSPITAL_COMMUNITY): Payer: Self-pay | Admitting: Certified Registered Nurse Anesthetist

## 2015-03-23 ENCOUNTER — Encounter (HOSPITAL_COMMUNITY): Admission: RE | Payer: Self-pay | Source: Ambulatory Visit

## 2015-03-23 ENCOUNTER — Ambulatory Visit (HOSPITAL_COMMUNITY)
Admission: RE | Admit: 2015-03-23 | Payer: BLUE CROSS/BLUE SHIELD | Source: Ambulatory Visit | Admitting: General Surgery

## 2015-03-23 ENCOUNTER — Telehealth (HOSPITAL_COMMUNITY): Payer: Self-pay | Admitting: *Deleted

## 2015-03-23 ENCOUNTER — Telehealth: Payer: Self-pay | Admitting: *Deleted

## 2015-03-23 SURGERY — INSERTION, TUNNELED CENTRAL VENOUS DEVICE, WITH PORT
Anesthesia: General

## 2015-03-23 MED ORDER — SODIUM CHLORIDE 0.9 % IJ SOLN
INTRAMUSCULAR | Status: AC
  Filled 2015-03-23: qty 10

## 2015-03-23 MED ORDER — ONDANSETRON HCL 4 MG/2ML IJ SOLN
INTRAMUSCULAR | Status: AC
  Filled 2015-03-23: qty 2

## 2015-03-23 MED ORDER — LIDOCAINE HCL (CARDIAC) 20 MG/ML IV SOLN
INTRAVENOUS | Status: AC
  Filled 2015-03-23: qty 5

## 2015-03-23 MED ORDER — PHENYLEPHRINE 40 MCG/ML (10ML) SYRINGE FOR IV PUSH (FOR BLOOD PRESSURE SUPPORT)
PREFILLED_SYRINGE | INTRAVENOUS | Status: AC
  Filled 2015-03-23: qty 10

## 2015-03-23 MED ORDER — EPHEDRINE SULFATE 50 MG/ML IJ SOLN
INTRAMUSCULAR | Status: AC
  Filled 2015-03-23: qty 1

## 2015-03-23 MED ORDER — PROPOFOL 10 MG/ML IV BOLUS
INTRAVENOUS | Status: AC
  Filled 2015-03-23: qty 40

## 2015-03-23 MED ORDER — ROCURONIUM BROMIDE 50 MG/5ML IV SOLN
INTRAVENOUS | Status: AC
  Filled 2015-03-23: qty 1

## 2015-03-23 MED ORDER — SUCCINYLCHOLINE CHLORIDE 20 MG/ML IJ SOLN
INTRAMUSCULAR | Status: AC
  Filled 2015-03-23: qty 1

## 2015-03-23 MED ORDER — MIDAZOLAM HCL 2 MG/2ML IJ SOLN
INTRAMUSCULAR | Status: AC
  Filled 2015-03-23: qty 2

## 2015-03-23 MED ORDER — FENTANYL CITRATE (PF) 250 MCG/5ML IJ SOLN
INTRAMUSCULAR | Status: AC
  Filled 2015-03-23: qty 5

## 2015-03-23 NOTE — Telephone Encounter (Signed)
Left vm with pt to see if she has decided the location she wishes to receive her care. Noted that she cancelled her port with Dr.Wakefield.

## 2015-03-25 ENCOUNTER — Ambulatory Visit (HOSPITAL_COMMUNITY): Payer: BLUE CROSS/BLUE SHIELD

## 2015-03-30 ENCOUNTER — Telehealth: Payer: Self-pay | Admitting: *Deleted

## 2015-03-30 NOTE — Telephone Encounter (Signed)
Spoke to pt concerning her decision on treatment location. Relate she and her mother are going to discuss it tonight. Request pt call and let us know her decision and that our main concern is that she is receiving care where she feels most comfortable. Pt agee's. Denies further needs or questions at this time. Encourage pt to call with concerns. Received verbal understanding.

## 2015-04-02 ENCOUNTER — Telehealth: Payer: Self-pay | Admitting: *Deleted

## 2015-04-02 NOTE — Telephone Encounter (Signed)
Left vm for pt to return call to discuss treatment center decision. Contact information provided.

## 2015-04-06 ENCOUNTER — Other Ambulatory Visit: Payer: BLUE CROSS/BLUE SHIELD

## 2015-04-06 ENCOUNTER — Encounter: Payer: BLUE CROSS/BLUE SHIELD | Admitting: Genetic Counselor

## 2015-04-06 ENCOUNTER — Encounter: Payer: Self-pay | Admitting: *Deleted

## 2015-04-06 NOTE — Progress Notes (Signed)
Pt receiving care at [facility]. Physician team notified.

## 2016-08-17 ENCOUNTER — Ambulatory Visit
Admission: RE | Admit: 2016-08-17 | Discharge: 2016-08-17 | Disposition: A | Discharge status: Home / Self Care | Payer: BC Managed Care – PPO | Attending: Geriatric Medicine | Admitting: Geriatric Medicine

## 2016-08-17 DIAGNOSIS — D508 Other iron deficiency anemias: Secondary | ICD-10-CM

## 2016-08-17 DIAGNOSIS — C50211 Malignant neoplasm of upper-inner quadrant of right female breast: Principal | ICD-10-CM

## 2016-08-17 DIAGNOSIS — Z171 Estrogen receptor negative status [ER-]: Secondary | ICD-10-CM

## 2016-11-29 ENCOUNTER — Ambulatory Visit
Admission: RE | Admit: 2016-11-29 | Discharge: 2016-12-01 | Disposition: A | Discharge status: Home / Self Care | Admitting: Radiation Oncology

## 2016-11-29 DIAGNOSIS — C50211 Malignant neoplasm of upper-inner quadrant of right female breast: Principal | ICD-10-CM

## 2016-11-29 DIAGNOSIS — Z171 Estrogen receptor negative status [ER-]: Secondary | ICD-10-CM

## 2017-03-01 ENCOUNTER — Ambulatory Visit: Admit: 2017-03-01 | Discharge: 2017-03-02 | Attending: Geriatric Medicine | Primary: Geriatric Medicine

## 2017-03-01 DIAGNOSIS — C50211 Malignant neoplasm of upper-inner quadrant of right female breast: Principal | ICD-10-CM

## 2017-03-01 DIAGNOSIS — Z171 Estrogen receptor negative status [ER-]: Secondary | ICD-10-CM

## 2017-03-22 IMAGING — MG MM DIAG BREAST TOMO BILATERAL
6 of 9 series · 6 of 25 positions shown · non-contrast
Comparison: None

CLINICAL DATA: Patient notes a palpable mass within the right
breast for the past several weeks. There is no family history of
breast cancer. However, there is a family history of ovarian cancer
in her maternal grandmother.

EXAM:
DIGITAL DIAGNOSTIC BILATERAL MAMMOGRAM WITH 3D TOMOSYNTHESIS WITH
CAD
ULTRASOUND RIGHT BREAST

[R TAN]
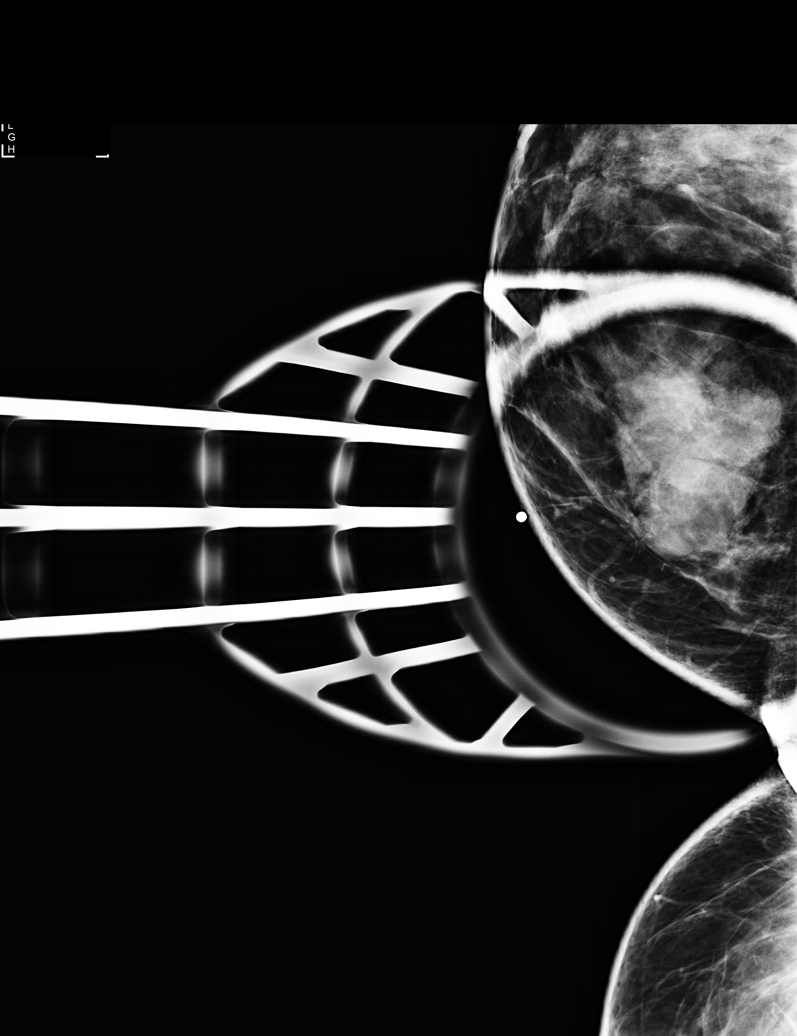

[R MLO]
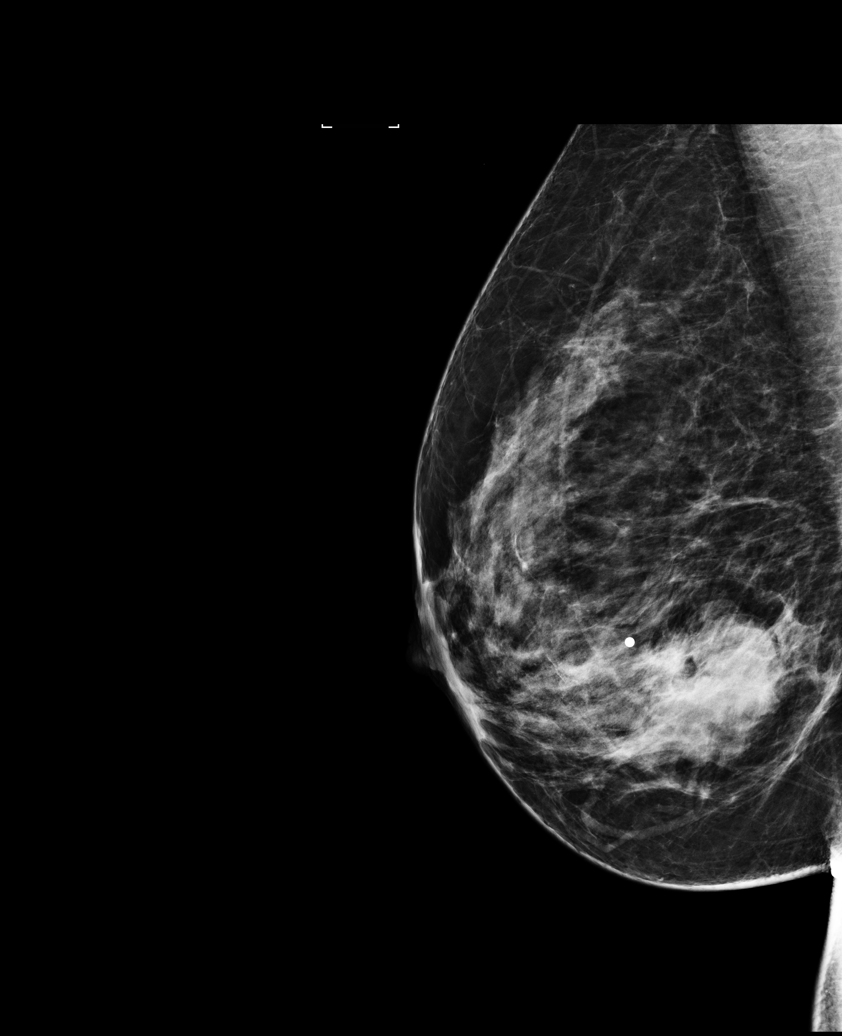

[L MLO]
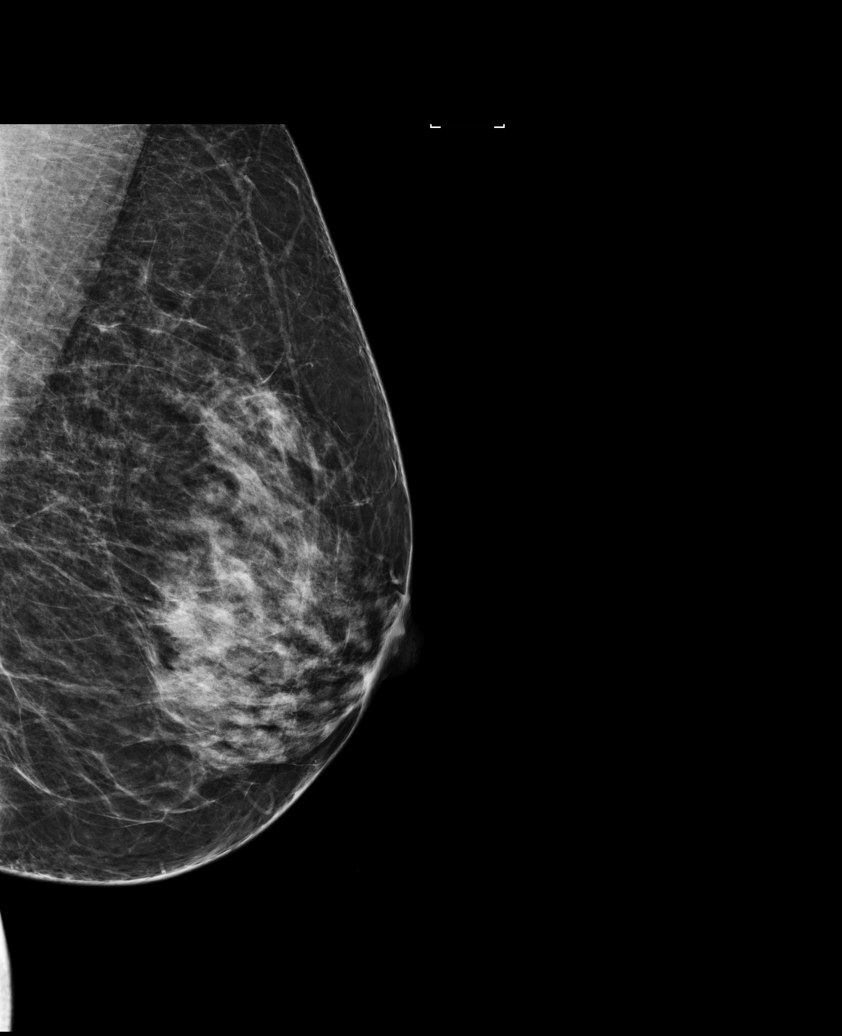

[R CC]
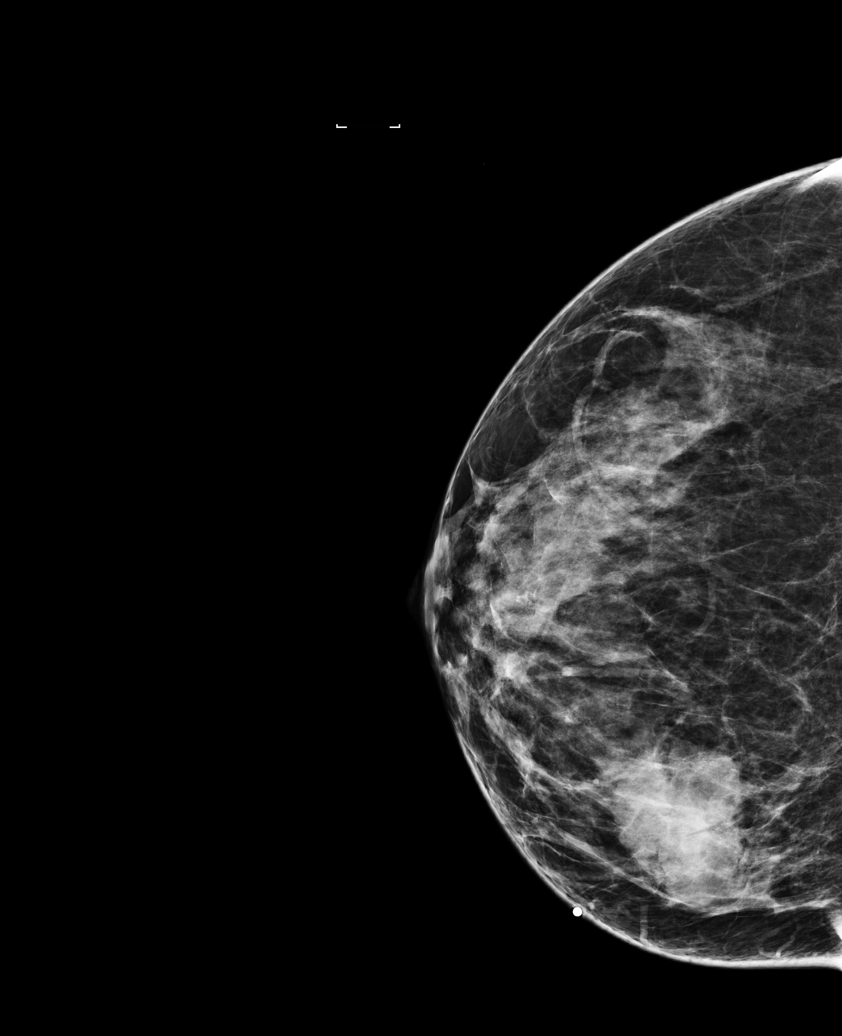

[L CC]
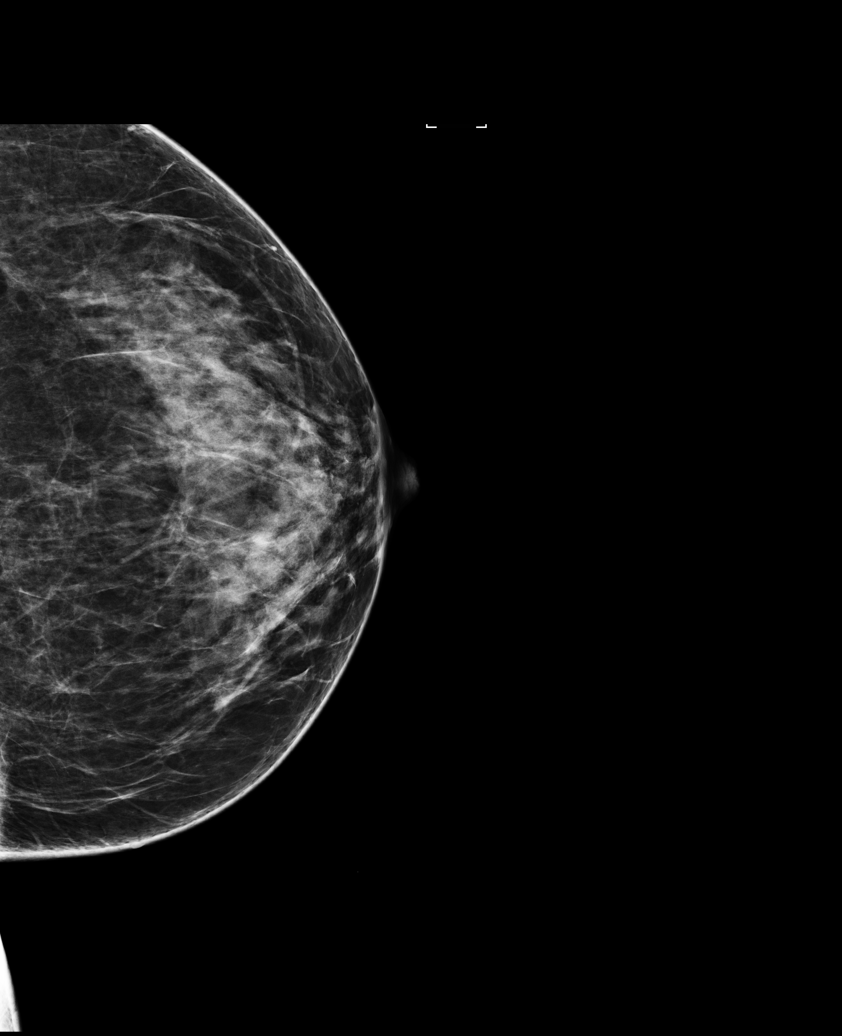

[L CC tomo · tomo slice 37/72.0]
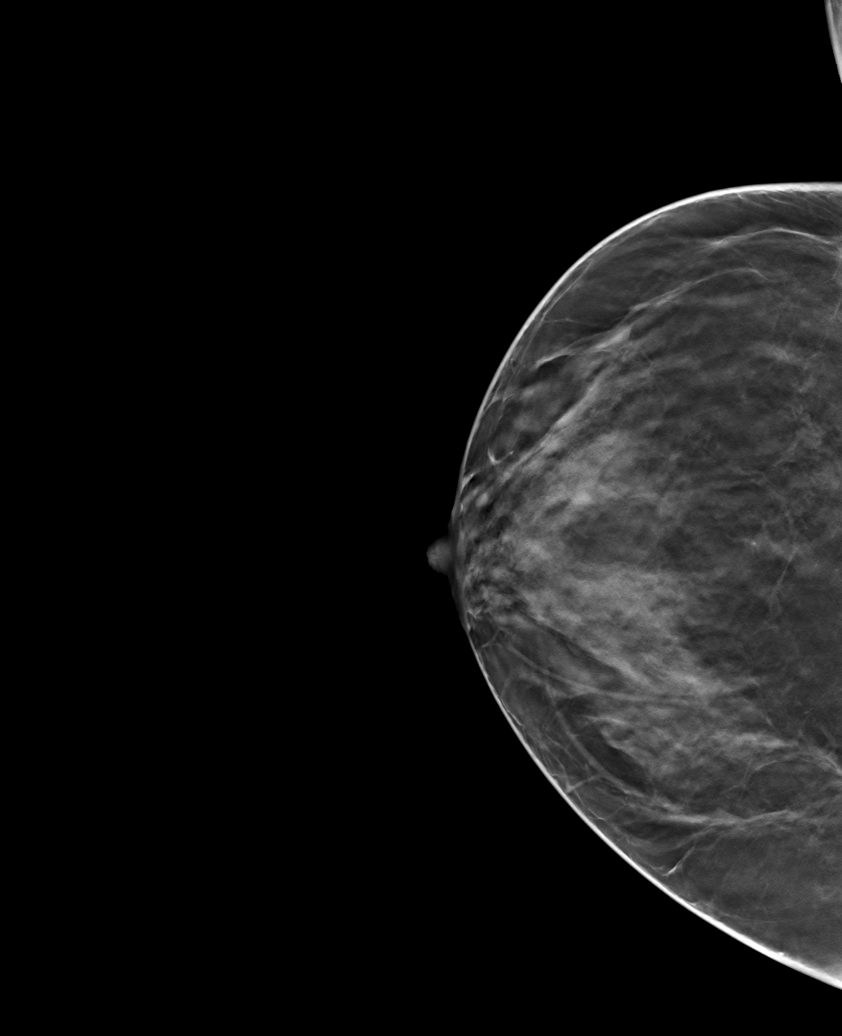

[6 of 25 positions shown; findings below may reference images not displayed]

ACR Breast Density Category c: The breast tissue is heterogeneously
dense, which may obscure small masses.
FINDINGS: There is an ill-defined mass located within the upper inner quadrant
of the right breast at approximately the 2:30 o'clock position 5 cm
from the nipple. By mammography this measures approximately 5 cm in
size.

Mammographic images were processed with CAD.

On physical exam, there is a firm palpable mass located within the
right breast at the 2:30 o'clock position 5 cm from the nipple. By
physical examination this measures approximately 5 cm in size. There
is no palpable right axillary adenopathy.

Targeted ultrasound is performed, showing an irregularly marginated
hypoechoic mass with increased vascular flow located within the
right breast at the 2:30 o'clock position 5 cm from the nipple
corresponding to the mammographic finding. This measures 4.4 x 3.1 x
3.6 cm in size. This is worrisome for invasive mammary carcinoma.

Ultrasound of the right axilla demonstrates an enlarged abnormal
appearing right axillary lymph node with loss of the normal fatty
hilum. This measures 3.7 x 2.5 x 1.7 cm in size.

Tissue sampling of the right breast mass in the enlarged right
axillary lymph node is recommended. This will be performed and
reported separately.
IMPRESSION: 4.4 cm irregular mass located within the right breast at the 2:30
o'clock position with enlarged right axillary lymph node. Findings
are worrisome for invasive mammary carcinoma with a metastatic lymph
node.

RECOMMENDATION:
Right breast and axillary lymph node ultrasound-guided core
biopsies.

I have discussed the findings and recommendations with the patient.
Results were also provided in writing at the conclusion of the
visit. If applicable, a reminder letter will be sent to the patient
regarding the next appointment.

BI-RADS CATEGORY  5: Highly suggestive of malignancy.

## 2017-05-24 ENCOUNTER — Ambulatory Visit: Admit: 2017-05-24 | Discharge: 2017-06-01 | Attending: Radiation Oncology | Primary: Radiation Oncology

## 2017-05-24 ENCOUNTER — Ambulatory Visit: Admit: 2017-05-24 | Discharge: 2017-05-25

## 2017-05-24 DIAGNOSIS — C50211 Malignant neoplasm of upper-inner quadrant of right female breast: Principal | ICD-10-CM

## 2017-05-24 DIAGNOSIS — Z171 Estrogen receptor negative status [ER-]: Secondary | ICD-10-CM

## 2017-11-22 ENCOUNTER — Ambulatory Visit: Admit: 2017-11-22 | Discharge: 2017-12-01 | Attending: Radiation Oncology | Primary: Radiation Oncology

## 2017-11-22 DIAGNOSIS — Z171 Estrogen receptor negative status [ER-]: Secondary | ICD-10-CM

## 2017-11-22 DIAGNOSIS — C50211 Malignant neoplasm of upper-inner quadrant of right female breast: Principal | ICD-10-CM

## 2018-05-30 ENCOUNTER — Ambulatory Visit: Admit: 2018-05-30 | Discharge: 2018-06-02 | Attending: Radiation Oncology | Primary: Radiation Oncology

## 2018-05-30 ENCOUNTER — Ambulatory Visit: Admit: 2018-05-30 | Discharge: 2018-05-30

## 2018-05-30 DIAGNOSIS — C50211 Malignant neoplasm of upper-inner quadrant of right female breast: Principal | ICD-10-CM

## 2018-05-30 DIAGNOSIS — Z171 Estrogen receptor negative status [ER-]: Secondary | ICD-10-CM

## 2018-11-25 ENCOUNTER — Ambulatory Visit: Admit: 2018-11-25 | Discharge: 2018-12-02

## 2019-05-29 ENCOUNTER — Ambulatory Visit: Admit: 2019-05-29 | Discharge: 2019-06-02 | Attending: Radiation Oncology | Primary: Radiation Oncology

## 2019-05-29 ENCOUNTER — Ambulatory Visit: Admit: 2019-05-29 | Discharge: 2019-05-29 | Payer: MEDICAID

## 2019-11-24 ENCOUNTER — Ambulatory Visit: Admit: 2019-11-24 | Discharge: 2019-12-02 | Attending: Radiation Oncology | Primary: Radiation Oncology

## 2019-11-24 DIAGNOSIS — Z923 Personal history of irradiation: Principal | ICD-10-CM

## 2019-11-24 DIAGNOSIS — Z08 Encounter for follow-up examination after completed treatment for malignant neoplasm: Principal | ICD-10-CM

## 2019-11-24 DIAGNOSIS — L819 Disorder of pigmentation, unspecified: Principal | ICD-10-CM

## 2019-11-24 DIAGNOSIS — Z9221 Personal history of antineoplastic chemotherapy: Principal | ICD-10-CM

## 2019-11-24 DIAGNOSIS — C50211 Malignant neoplasm of upper-inner quadrant of right female breast: Principal | ICD-10-CM

## 2019-11-24 DIAGNOSIS — Z171 Estrogen receptor negative status [ER-]: Principal | ICD-10-CM

## 2019-11-24 DIAGNOSIS — Z9011 Acquired absence of right breast and nipple: Principal | ICD-10-CM

## 2019-11-24 DIAGNOSIS — Z853 Personal history of malignant neoplasm of breast: Principal | ICD-10-CM

## 2020-05-27 ENCOUNTER — Encounter
Admit: 2020-05-27 | Discharge: 2020-06-01 | Payer: PRIVATE HEALTH INSURANCE | Attending: Radiation Oncology | Primary: Radiation Oncology

## 2020-05-27 ENCOUNTER — Encounter: Admit: 2020-05-27 | Discharge: 2020-06-01 | Payer: PRIVATE HEALTH INSURANCE

## 2020-11-30 ENCOUNTER — Ambulatory Visit
Admit: 2020-11-30 | Discharge: 2020-12-01 | Payer: PRIVATE HEALTH INSURANCE | Attending: Radiation Oncology | Primary: Radiation Oncology

## 2020-11-30 ENCOUNTER — Ambulatory Visit: Admit: 2020-11-30 | Payer: PRIVATE HEALTH INSURANCE | Attending: Radiation Oncology | Primary: Radiation Oncology

## 2021-06-06 ENCOUNTER — Ambulatory Visit
Admit: 2021-06-06 | Discharge: 2021-06-07 | Payer: PRIVATE HEALTH INSURANCE | Attending: Radiation Oncology | Primary: Radiation Oncology

## 2021-06-06 ENCOUNTER — Ambulatory Visit: Admit: 2021-06-06 | Payer: PRIVATE HEALTH INSURANCE | Attending: Radiation Oncology | Primary: Radiation Oncology

## 2021-06-06 ENCOUNTER — Ambulatory Visit: Admit: 2021-06-06 | Discharge: 2021-06-06 | Payer: PRIVATE HEALTH INSURANCE

## 2021-06-06 ENCOUNTER — Ambulatory Visit: Admit: 2021-06-06 | Payer: BLUE CROSS/BLUE SHIELD | Attending: Radiation Oncology | Primary: Radiation Oncology

## 2021-06-15 ENCOUNTER — Ambulatory Visit: Admit: 2021-06-15 | Discharge: 2021-06-16 | Payer: BLUE CROSS/BLUE SHIELD

## 2021-06-15 ENCOUNTER — Ambulatory Visit: Admit: 2021-06-15 | Discharge: 2021-06-16 | Payer: MEDICAID

## 2021-06-15 DIAGNOSIS — R928 Other abnormal and inconclusive findings on diagnostic imaging of breast: Principal | ICD-10-CM

## 2021-06-15 DIAGNOSIS — C50211 Malignant neoplasm of upper-inner quadrant of right female breast: Principal | ICD-10-CM

## 2021-06-16 ENCOUNTER — Ambulatory Visit: Admit: 2021-06-16 | Discharge: 2021-06-17 | Payer: MEDICAID

## 2021-06-16 ENCOUNTER — Ambulatory Visit: Admit: 2021-06-16 | Discharge: 2021-06-17 | Payer: BLUE CROSS/BLUE SHIELD

## 2021-06-21 DIAGNOSIS — C50911 Malignant neoplasm of unspecified site of right female breast: Principal | ICD-10-CM

## 2021-06-27 ENCOUNTER — Ambulatory Visit: Admit: 2021-06-27 | Discharge: 2021-06-28

## 2021-06-27 DIAGNOSIS — C50911 Malignant neoplasm of unspecified site of right female breast: Principal | ICD-10-CM

## 2021-06-27 DIAGNOSIS — Z171 Estrogen receptor negative status [ER-]: Principal | ICD-10-CM

## 2021-06-28 ENCOUNTER — Ambulatory Visit: Admit: 2021-06-28 | Discharge: 2021-06-29

## 2021-06-28 DIAGNOSIS — Q819 Epidermolysis bullosa, unspecified: Principal | ICD-10-CM

## 2021-06-28 DIAGNOSIS — C50911 Malignant neoplasm of unspecified site of right female breast: Principal | ICD-10-CM

## 2021-06-30 ENCOUNTER — Ambulatory Visit: Admit: 2021-06-30 | Discharge: 2021-07-02 | Payer: MEDICAID

## 2021-06-30 ENCOUNTER — Telehealth: Admit: 2021-06-30 | Discharge: 2021-07-02 | Payer: MEDICAID | Attending: MS" | Primary: MS"

## 2021-06-30 ENCOUNTER — Ambulatory Visit: Admit: 2021-06-30 | Discharge: 2021-07-02

## 2021-06-30 DIAGNOSIS — C50911 Malignant neoplasm of unspecified site of right female breast: Principal | ICD-10-CM

## 2021-06-30 DIAGNOSIS — Z171 Estrogen receptor negative status [ER-]: Principal | ICD-10-CM

## 2021-07-01 DIAGNOSIS — C50911 Malignant neoplasm of unspecified site of right female breast: Principal | ICD-10-CM

## 2021-07-01 DIAGNOSIS — Z171 Estrogen receptor negative status [ER-]: Principal | ICD-10-CM

## 2021-07-06 ENCOUNTER — Ambulatory Visit: Admit: 2021-07-06 | Discharge: 2021-07-06

## 2021-07-06 DIAGNOSIS — C50911 Malignant neoplasm of unspecified site of right female breast: Principal | ICD-10-CM

## 2021-07-07 DIAGNOSIS — C50911 Malignant neoplasm of unspecified site of right female breast: Principal | ICD-10-CM

## 2021-07-07 DIAGNOSIS — Z171 Estrogen receptor negative status [ER-]: Principal | ICD-10-CM

## 2021-07-14 MED ORDER — PROCHLORPERAZINE MALEATE 10 MG TABLET
ORAL_TABLET | Freq: Four times a day (QID) | ORAL | 2 refills | 8 days | Status: CP | PRN
Start: 2021-07-14 — End: ?

## 2021-07-14 MED ORDER — ONDANSETRON HCL 8 MG TABLET
ORAL_TABLET | Freq: Two times a day (BID) | ORAL | 5 refills | 15 days | Status: CP
Start: 2021-07-14 — End: ?

## 2021-07-22 ENCOUNTER — Ambulatory Visit: Admit: 2021-07-22 | Discharge: 2021-07-22 | Payer: MEDICAID

## 2021-07-22 ENCOUNTER — Institutional Professional Consult (permissible substitution): Admit: 2021-07-22 | Discharge: 2021-07-22 | Payer: MEDICAID

## 2021-07-22 ENCOUNTER — Ambulatory Visit: Admit: 2021-07-22 | Discharge: 2021-07-22 | Payer: MEDICAID | Attending: Medical Oncology | Primary: Medical Oncology

## 2021-07-22 DIAGNOSIS — Z171 Estrogen receptor negative status [ER-]: Principal | ICD-10-CM

## 2021-07-22 DIAGNOSIS — C50911 Malignant neoplasm of unspecified site of right female breast: Principal | ICD-10-CM

## 2021-07-22 DIAGNOSIS — C50011 Malignant neoplasm of nipple and areola, right female breast: Principal | ICD-10-CM

## 2021-07-22 MED ORDER — FERROUS SULFATE 325 MG (65 MG IRON) TABLET
ORAL_TABLET | Freq: Every day | ORAL | 5 refills | 30 days | Status: CP
Start: 2021-07-22 — End: ?

## 2021-07-29 ENCOUNTER — Ambulatory Visit: Admit: 2021-07-29 | Discharge: 2021-07-29 | Payer: MEDICAID

## 2021-07-29 ENCOUNTER — Institutional Professional Consult (permissible substitution): Admit: 2021-07-29 | Discharge: 2021-07-29 | Payer: MEDICAID

## 2021-07-29 DIAGNOSIS — C50911 Malignant neoplasm of unspecified site of right female breast: Principal | ICD-10-CM

## 2021-07-29 DIAGNOSIS — Z171 Estrogen receptor negative status [ER-]: Principal | ICD-10-CM

## 2021-08-05 ENCOUNTER — Institutional Professional Consult (permissible substitution): Admit: 2021-08-05 | Discharge: 2021-08-05 | Payer: MEDICAID

## 2021-08-05 ENCOUNTER — Ambulatory Visit: Admit: 2021-08-05 | Discharge: 2021-08-05 | Payer: MEDICAID

## 2021-08-05 DIAGNOSIS — Z171 Estrogen receptor negative status [ER-]: Principal | ICD-10-CM

## 2021-08-05 DIAGNOSIS — C50911 Malignant neoplasm of unspecified site of right female breast: Principal | ICD-10-CM

## 2021-08-05 DIAGNOSIS — Q819 Epidermolysis bullosa, unspecified: Principal | ICD-10-CM

## 2021-08-05 DIAGNOSIS — C50011 Malignant neoplasm of nipple and areola, right female breast: Principal | ICD-10-CM

## 2021-08-05 MED ORDER — SILVER SULFADIAZINE 1 % TOPICAL CREAM
1 refills | 0 days | Status: CP
Start: 2021-08-05 — End: 2022-08-05

## 2021-08-12 ENCOUNTER — Ambulatory Visit: Admit: 2021-08-12 | Discharge: 2021-08-13 | Payer: MEDICAID

## 2021-08-12 DIAGNOSIS — C50011 Malignant neoplasm of nipple and areola, right female breast: Principal | ICD-10-CM

## 2021-08-12 DIAGNOSIS — C50911 Malignant neoplasm of unspecified site of right female breast: Principal | ICD-10-CM

## 2021-08-12 DIAGNOSIS — Z171 Estrogen receptor negative status [ER-]: Principal | ICD-10-CM

## 2021-08-19 ENCOUNTER — Institutional Professional Consult (permissible substitution): Admit: 2021-08-19 | Discharge: 2021-08-19 | Payer: MEDICAID

## 2021-08-19 ENCOUNTER — Ambulatory Visit: Admit: 2021-08-19 | Discharge: 2021-08-19 | Payer: MEDICAID | Attending: Medical Oncology | Primary: Medical Oncology

## 2021-08-19 ENCOUNTER — Ambulatory Visit: Admit: 2021-08-19 | Discharge: 2021-08-19 | Payer: MEDICAID

## 2021-08-19 DIAGNOSIS — Z171 Estrogen receptor negative status [ER-]: Principal | ICD-10-CM

## 2021-08-19 DIAGNOSIS — C50011 Malignant neoplasm of nipple and areola, right female breast: Principal | ICD-10-CM

## 2021-08-19 DIAGNOSIS — C50911 Malignant neoplasm of unspecified site of right female breast: Principal | ICD-10-CM

## 2021-08-26 ENCOUNTER — Ambulatory Visit: Admit: 2021-08-26 | Discharge: 2021-08-27 | Payer: PRIVATE HEALTH INSURANCE

## 2021-08-26 DIAGNOSIS — Z171 Estrogen receptor negative status [ER-]: Principal | ICD-10-CM

## 2021-08-26 DIAGNOSIS — C50911 Malignant neoplasm of unspecified site of right female breast: Principal | ICD-10-CM

## 2021-08-26 DIAGNOSIS — C50011 Malignant neoplasm of nipple and areola, right female breast: Principal | ICD-10-CM

## 2021-08-29 DIAGNOSIS — C50911 Malignant neoplasm of unspecified site of right female breast: Principal | ICD-10-CM

## 2021-08-29 DIAGNOSIS — Z171 Estrogen receptor negative status [ER-]: Principal | ICD-10-CM

## 2021-08-29 DIAGNOSIS — T451X5A Adverse effect of antineoplastic and immunosuppressive drugs, initial encounter: Principal | ICD-10-CM

## 2021-08-29 DIAGNOSIS — D701 Agranulocytosis secondary to cancer chemotherapy: Principal | ICD-10-CM

## 2021-08-29 MED ORDER — FILGRASTIM-AAFI 480 MCG/0.8 ML SUBCUTANEOUS SYRINGE
Freq: Every day | SUBCUTANEOUS | 1 refills | 3 days | Status: CP
Start: 2021-08-29 — End: ?
  Filled 2021-08-30: qty 2.4, 21d supply, fill #0

## 2021-08-29 NOTE — Unmapped (Signed)
Medical Oncology Return Patient Note    Patient Name: Tara Lee  Patient Age: 36 y.o.  Encounter Date: 09/02/2021    PCP: Shade Flood, MD    Cancer Team  Surgical Oncology: Sherilyn Cooter, DO  Surgical Oncology NP: Pollyann Samples, NP and Sonda Primes, NP  Radiation Oncology: Thom Chimes, MD and Konrad Penta, NP  Medical Oncology: Marc Morgans, MD  Plastic Surgery: None at this time    Genetics: [employer] Breast Cancer STAT panel with BRCA1/2, CDH1, PALB2, PTEN and TP53, on 04/01/2015, was negative.    Reason for visit:  Follow up for management of breast cancer.    Assessment/Plan    1. 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 Her staging CT and Bone Scan do not show evidence of metastatic disease.  There is a question of uptake in the sternum. This area was evaluated on breast MRI which shows that the mass extends within the medial dermis and posteriorly to the anterior fibers of the pectoralis muscle. She declined AC due to concerns about secondary hematologic malignancy. Port catheter placed 7/5.   --Initiated weekly Taxol + q3w carboplatin + pembrolizumab on 07/22/21 appointment.   --DR taxol to 80% 08/19/21 due to leukopenia and neutropenia. C2D15 taxol held and not made up, again for neutropenia.   --Holding C3 D1 chemotherapy today due to ANC 800. Repeat labs in 1 week and reconsider treatment.  --Adding daily GCSF during Carbo weeks x 3 days starting 24h after chemo.  Advised Claritin 10mg  daily for 5-7 days during this time to help prevent bone pain.  RN did teaching today with patient's sister regarding injection technique.    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  Would like to re involve Dermatology for support as she enters breast cancer treatment again.   - Ambulatory referral to Dermatology; Future  - 8/4 Rx silvadene cream to use PRN    Genetics: [employer] panel from March 2017 negative. Discussed updated genetic testing w/counselor on 6/29 but elected to defer testing at this time to focus on current treatment     Bone Health  DEXA 06/28/21: Asymmetric uptake in the sternum is of uncertain significance and has no CT correlate; recommend attention on follow-up. No other evidence of osseous metastatic disease.    Supportive care  - Referral to [facility] for additional support  - Declines Oncofertility referral, discussed ovarian suppression during chemo, she will consider.  - Is not interested in Dignicap, but will plan chemo weekly in [place].     Communication  - Patient prefers to receive clinical information by phone in between appointments versus myChart messages.    Dispo:  --RTC in 1 week for reconsideration of C3D1 Taxol + carboplatin + pembro w/ labs and provider visit  --Monitor labs prior to next week's treatment - I will see her in the infusion chair next week.  --Plan discussed today with Dr. Avis Epley in clinic.  -------------------------------------------------------------------------------  Interval hx/ROS:  Tara Lee presents today with her mother & her sister for ongoing management of her breast cancer.   --Feeling well. No PN symptoms.  No interval illness or fevers. Last had chemo 8/18, chemo held last week due to neutropenia.  --First two cycles of the q3 week treatments  rough over the weekend following infusions; reports reduced appetite the Saturday following treatment which gradually resolves by the following Tuesday.   --Single taxol treatments much better tolerated.  --Reports hair loss, dry mouth  --Reports nausea after first treatment; taking Zofran  --Skin is actually doing well.... lesions on forearms not of concern. Reports smooth skin on face  --Denies mouth sores, SOB, cough  --Reports breast mass is palpably smaller  --Full 12 ROS reviewed and otherwise negative/none      History of Present Illness   Tara Lee is a 36 y.o. woman who presents today accompanied by her mother for medical oncology evaluation of a\n in-breast tumor recurrence of her previously diagnosed right breast cancer.     Previously she was dx 03/2015 with a cT2N1 triple negative breast cancer at age 84.  She received neoadjuvant Carbo/Taxol, but declined AC chemotherapy.  Denies any chemotherapy induced peripheral neuropathy symptoms from her prior chemotherapy.   --She underwent a R breast lumpectomy and ALND with residual 8mm tumor and 1/27 axillary lymph nodes involved with tumor, RCB II.  She completed right breast RT in 11/2015.  --Had negative genetic testing at this time.  --She has followed routinely with Konrad Penta, NP in Radiation Oncology for surveillance.  She was a former medical oncology patient of Dr. Archie Balboa.     06/06/21 Mammogram revealed a 2.4cm right breast mass at the prior lumpectomy side.    --Biopsy of the 3oc mass 3cm FN revealed gr3 IDCA, triple negative.  --She saw Dr. Dellis Anes who rec'd consideration of neoadjuvant chemotherapy.   --She has staging CT and BS today and tomorrow and breast MRI 6/27.    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 Previously saw [facility]. Inis Sizer at [facility] during her prior chemotherapy treatment, but tolerated chemotherapy, port placement, breast surgery and XRT very well without major skin toxicities.     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 She lives with her mother, since her chemo in 2017. Is not interested in childbearing.     Breast Cancer History  Oncology History Overview Note   2023: Recurrent Right breast cT2 cN0 IDC, G3 -/-/-  2017: Right breast ypT1b ypN54mic M0 IDC, G3, -/-/-       Malignant neoplasm of right breast in female, estrogen receptor negative (CMS-HCC)   02/23/2015 -  Presenting Symptoms    Presented to [facility] for evaluation of self detected right breast mass. Noticed bleeding, nipple discharge and blister after scratching an itchy area. Has a history of Epidermolysis bullosa.      03/03/2015 Initial Diagnosis    Malignant neoplasm of upper-inner quadrant of female breast [facility]): Clinical stage: At least IIB [cT2, cN1]     03/03/2015 Interval Scan(s)    MMG/US in [place]: 4.4 cm irregular mass in the right breast at 2:30 position with enlarged right axillary LN measuring 3.7 cm.     03/03/2015 Biopsy    Rt core 2:30: Gr 3, LVI+, IDC. ER (0), PR (0), HER-2 FISH neg (# 2.05, ratio 1.37) Ki-67 90%. No DCIS. Rt axillary LN: Positive for carcinoma, similar to carcinoma in breast core. No LN tissue. ER (0), PR (0), HER-2 FISH neg (#2.3, ratio 1.18) Ki-67 70%.     03/11/2015 Interval Scan(s)    MRI breast: R LIQ 4.7 x 4 x 4cm bx proven cancer, add'l 1.3cm  spic mass 1.5cm ant to cancer, add'l 7mm mass superior (prob fibroadenoma), R ax lymphadenopathy, retropectoral LN, 1.4cm susp internal mam LN. L breast clear. Rec bx NME & mass if BCT possible     03/17/2015 -  Other    MDC rec: cT2N1 TN IDC. Neoadj chemo. Needs staging. Discussed epidermolysis bullosa, will get in with [facility] derm. Skin delicate and would likely not tolerate recon if mastectomy needed. Meeting genetics. Fertility discussion. Rec adj XRT. MRI int review.     03/24/2015 Interval Scan(s)    CT CAP: Heterogeneous right breast mass and right axillary adenopathy. Two subcentimeter hypodensities in the liver are indeterminate. Attention on followup. Otherwise, no evidence of metastatic disease in the abdomen or pelvis.     03/24/2015 Interval Scan(s)    Bone scan: No osseous mets.     03/24/2015 Genetics    Negative for BRCA1, BRCA2, CDH1, PALB2, PTEN, and TP53.     04/14/2015 Biopsy    MRI biopsy R breast NME: benign breast tissue, no atypia or carcinoma. Additional R breast mass biopsy: benign breast tissue with dense stromal fibrosis, no atypia or carcinoma.     04/15/2015 - 07/18/2015 Chemotherapy    Chemotherapy Treatment    Treatment Goal Curative   Line of Treatment [No plan line of treatment]   Plan Name OP Breast Carboplatin / weekly PACLitaxel   Start Date 04/15/2015   End Date 07/08/2015   Provider Olivia Mackie, MD   Chemotherapy dexamethasone (DECADRON) tablet 12 mg, 12 mg, Oral, Once, 4 of 4 cycles    CARBOplatin (PARAPLATIN) 900 mg IVPB, 900 mg (100 % of original dose 900 mg), Intravenous, Once, 4 of 4 cycles  Dose modification:   (original dose 900 mg, Cycle 1),   (original dose 900 mg, Cycle 2),   (original dose 900 mg, Cycle 3),   (original dose 900 mg, Cycle 4),   (original dose 900 mg, Cycle 4),   (original dose 900 mg, Cycle 4),   (original dose 900 mg, Cycle 4),   (original dose 900 mg, Cycle 4),   (original dose 900 mg, Cycle 4),   (original dose 900 mg, Cycle 4)    PACLItaxel (TAXOL) 166.38 mg in sodium chloride 0.9% NON-PVC (NS) 250 mL IVPB, 80 mg/m2 = 166.38 mg, Intravenous, Once, 4 of 4 cycles          08/10/2015 Surgery    Right partial mastectomy w ALND w ARM: residual 8mm of IDC, Gr 2, ER/PR/H2N negative, negative margins.  1/27 lymph nodes with metastatic disease, largest focus measuring 0.22mm, + LVI, No ECE.  RCB- II (2.174)        - 11/18/2015 Radiation     R breast and lymph nodes: 200 cGy x 25 = 5,000 cGy(Goal for IMN 95% at 45 Gy)  Boost to tumor bed: 200 cGy x 5 = 1000 cGY  Boost to IMN: 200 cGy x 8 = 1600 cGy with electrons     06/15/2021 Interval Scan(s)    MMG/US: Right breast, 3:00 3 CFN, there is a 2.4 x 1.9 x 2.0 cm solid irregular mass.     06/16/2021 Recurrence    Right breast USG core biopsy: IDC, G3, ER-, PR-, Her2-(1+).     06/27/2021 -  Cancer Staged    Staging form: Breast, AJCC 8th Edition  - Clinical stage from 06/27/2021: Stage IIB (rcT2, cN0, cM0, G3, ER-, PR-, HER2-) - Signed by Rudie Meyer, ANP on 06/27/2021 06/29/2021 Tumor Board  MDC recs: History of right breast TNBC (ypT1b ypN68mi) s/p partial mastectomy and ALND. Now with recurrent right TNBC.  TC previously. On exam, this is tethered to the chest wall at the inframammary fold. Imaging review with it abutting, but does not look to be involving. Discussed consideration for NACT again. 2.7 cm on imaging, 4.5 cm on exam.   Genetics: prior limited GT (BRCA1, BRCA2, CDH1, PALB2, PTEN, TP53) that was negative in 2017. Recommend referral to genetics for updated assessment/discussion for additional GT.     07/22/2021 -  Chemotherapy    OP breast paclitaxel weekly/ carboplatin q3 weeks/ pembro q3 weeks  PACLitaxel 80 mg/m2 IV on Days 1, 8, and 15 / CARBOplatin AUC 1.5 IV on Days 1, 8, and 15 / Pembrolizumab 200 mg IV on Day 1  21-day cycle x 4 cycles    Followed by:  DOXOrubicin 60 mg/m2 IV on Day 1 / Cyclophosphamide 600 mg/m2 IV on Day 1 / Pembrolizumab 200 mg IV on Day 1  21-day cycle x 4 cycles           Past Medical History  Past Medical History:   Diagnosis Date    Breast cancer (CMS-HCC)     Right 2017    Epidermolysis bullosa        Reproductive History  Menarche: 11   G0 P0  LMP: 06/06/21  Current Contraception:   OCP use: no      Surgical History  Past Surgical History:   Procedure Laterality Date    BREAST BIOPSY Right     malignant    BREAST LUMPECTOMY Right 08/10/2015    CHEMOTHERAPY Right 03/2015    IR INSERT PORT AGE GREATER THAN 5 YRS  07/06/2021    IR INSERT PORT AGE GREATER THAN 5 YRS 07/06/2021 Dorene Ar, PA IMG [facility]IR [facility]    PR MASTECTOMY, PARTIAL Right 08/10/2015    Procedure: MASTECTOMY, PARTIAL (EG, LUMPECTOMY, TYLECTOMY, QUADRANTECTOMY, SEGMENTECTOMY);  Surgeon: Talbert Cage, DO;  Location: [facility];  Service: Surgical Oncology    PR REMOVE ARMPITS LYMPH NODES COMPLT Right 08/10/2015    Procedure: AXILLARY LYMPHADENECTOMY; COMPLETE;  Surgeon: Talbert Cage, DO;  Location: [facility];  Service: Surgical Oncology    RADIATION Medications  Current Outpatient Medications   Medication Sig Dispense Refill    chlorhexidine (HIBICLENS) 4 % external liquid Apply topically daily as needed. 473 mL 12    ferrous sulfate 325 (65 FE) MG tablet Take 1 tablet (325 mg total) by mouth in the morning. 30 tablet 5    filgrastim-aafi (NIVESTYM) 480 mcg/0.8 mL Syrg injection syringe Inject the contents of 1 syringe (480 mcg total) under the skin daily for 3 days starting 24 hours after carboplatin chemo. Cycle is every 21 days. 2.4 mL 1    ondansetron (ZOFRAN) 8 MG tablet Take 1 tablet (8 mg total) by mouth two (2) times a day. Take 8 mg twice a day on days 2, 3, and 4. AND then, 8 mg every 8 hours as needed. 30 tablet 5    prochlorperazine (COMPAZINE) 10 MG tablet Take 1 tablet (10 mg total) by mouth every six (6) hours as needed (Nausea/Vomiting). 30 tablet 2    silver sulfaDIAZINE (SILVADENE, SSD) 1 % cream Apply to affected area once or twice daily. 50 g 1     No current facility-administered medications for this visit.       Allergies  No Known Allergies    Personal and Social  History  Social History     Social History Narrative    Lives in [place], [place]. She currently works at [employer]. Smoked half pack per day of cigarettes/cigars for 5 years when she was in college. Quit smoking a few years ago. She enjoys Bible dancing. She has strong spiritual beliefs and faith.       Family History  Mat grand with ovarian CA    Review of Systems: A complete review of systems was obtained including: Constitutional, Eyes, ENT, Cardiovascular, Respiratory, GI, GU, Musculoskeletal, Skin, Neurological, Psychiatric, Endocrine, Heme/Lymphatic, and Allergic/Immunologic systems. It is negative or non-contributory to the patient???s management except for the following: see interval history for details      Physical Exam  GENERAL: Well appearing female in no acute distress.  VITAL SIGNS: BP 144/89  - Pulse 74  - Temp 36.8 ??C (98.3 ??F) (Temporal)  - Resp 16  - SpO2 100%   HEENT: Normocephalic, symmetric. EOMI. Sclera clear.  LYMPH: no cervical, supraclavicular or axillary lymphadenopathy.  CHEST/BREASTS: There is a much smaller firm mass in the medial right breast at 3 o'clock 7 CFN at sternal border, measuring 1.0 x 2.0cm. Remainder of the bilateral breasts without mass, concerning skin change, retraction or nipple discharge.  No lymphedema noted.  LUNGS: respirations even and unlabored, clear to ausculation bilaterally.  CARDIAC: RRR, no murmurs.   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  2 bulla on midabdomen, patient reports from jeans waistband. No erythema or swelling around these lesions.   EXTREMITIES: No upper or lower extremity edema, normal skin tone.  MSK: Spine is nontender.  NEURO: Alert, oriented, normal gait & coordination.    Orders/Results  Reviewed in EMR    A total of 40 minutes were spent face-to-face and non-face-to-face in the care of this patient, which includes all pre, intra, and post visit time on the date of service.

## 2021-08-30 NOTE — Unmapped (Signed)
[facility]   Patient Onboarding/Medication Counseling    Tara Lee is a 36 y.o. female with chemo induced neutropenia who I am counseling today on initiation of therapy.  I am speaking to the patient.    Was a Nurse, learning disability used for this call? No    Verified patient's date of birth / HIPAA.    Specialty medication(s) to be sent: Hematology/Oncology: Melanee Spry      Non-specialty medications/supplies to be sent: n/a      Medications not needed at this time: n/a         Nivestym (filgrastim)    Medication & Administration     Dosage: Inject the contents of 1 syringe daily for 3 days starting 24 hours after carboplatin chemo    Administration: Inject under the skin of the thigh, abdomen or upper arm. Rotate sites with each injection.  ??? Injection instructions   o Take 1 syringe out of the refrigerator and allow to stand at room temperature for at least 30 minutes  o Wash hands and remove syringe from the tray  o Check the syringe for the following   - Expiration date  - Medication is clear and colorless and free from particles  - It is normal to see 1 or more air bubbles in the syringe and removal of the air is not necessary  - It appears unused or damaged and the needle cap is securely attached  o Choose your injection site (abdomen but not within 2 inches of navel, thigh or if someone else is injecting may also use upper arms or upper outer area of buttocks)  o Clean the injection site with an alcohol wipe using a circular motion and allow to air dry completely  o Pull the needle cap straight off and discard  o If you have been prescribed a partial dose slowly push up on the plunger rod to push out the extra air and medication until the end of conical base (edge) of the plunger stopper lines up with the syringe marking for your prescribed dose otherwise use the entire amount in the syringe  o Hold the syringe like a dart (just under the finger grips) with your thumb and index finger.  o Pinch the skin and insert the needle at a 45-90 degree angle (keep skin pinched while injecting)  o Push the plunger head down to deliver dose using a slow and constant pressure until the plunger head reaches the bottom and hold syringe in place for 5 seconds  o Keep the plunger rod fully pressed down while you carefully pull the needle straight out from the injection site.  o As you let go of the plunger rod the needle guard will automatically slide over the needle until the needle is completely covered and the needle guard locks into place  o If there is blood at the injection site gently press a cotton ball or gauze to the site. Do not rub the injection site.  o Dispose of the used prefilled syringe into a sharps container or hard plastic bottle.    Adherence/Missed dose instruction: If a dose is missed, call your doctor.    Goals of Therapy     Stimulate the growth of neutrophils (a type of white blood important to fight against infection) used after chemotherapy.    Side Effects & Monitoring Parameters   ??? Injection site irritation  ??? Pain/aching in the bones, arms and legs    The following side effects should  be reported to the provider:  ??? Signs of an allergic reaction    Contraindications, Warnings, & Precautions     ??? Hypersensitivity  ??? Hypersensitivity to latex    Drug/Food Interactions     ??? Medication list reviewed in Epic. The patient was instructed to inform the care team before taking any new medications or supplements. No drug interactions identified.     Storage, Handling Precautions, & Disposal     ??? Nivestym should be stored in the refrigerator.   ??? Avoid freezing syringe   ??? Do not leave the prefilled syringe in direct sunlight  ??? Throw away Nivestym syringe that has been left at room temperature for more than 24 hours   ??? Do not shake the prefilled syringe  ??? Keep out of the reach of children  ??? Place used devices into a sharps container for disposal (which we can supply along with band-aids and alcohol pads) or hard plastic container         Current Medications (including OTC/herbals), Comorbidities and Allergies     Current Outpatient Medications   Medication Sig Dispense Refill   ??? chlorhexidine (HIBICLENS) 4 % external liquid Apply topically daily as needed. 473 mL 12   ??? ferrous sulfate 325 (65 FE) MG tablet Take 1 tablet (325 mg total) by mouth in the morning. 30 tablet 5   ??? filgrastim-aafi (NIVESTYM) 480 mcg/0.8 mL Syrg injection syringe Inject 0.8 mL (480 mcg total) under the skin daily for 3 days starting 24 hours after carboplatin chemo. Cycle is every 21 days. 2.4 mL 1   ??? ondansetron (ZOFRAN) 8 MG tablet Take 1 tablet (8 mg total) by mouth two (2) times a day. Take 8 mg twice a day on days 2, 3, and 4. AND then, 8 mg every 8 hours as needed. 30 tablet 5   ??? prochlorperazine (COMPAZINE) 10 MG tablet Take 1 tablet (10 mg total) by mouth every six (6) hours as needed (Nausea/Vomiting). 30 tablet 2   ??? silver sulfaDIAZINE (SILVADENE, SSD) 1 % cream Apply to affected area once or twice daily. 50 g 1     No current facility-administered medications for this visit.       No Known Allergies    Patient Active Problem List   Diagnosis   ??? Malignant neoplasm of right breast in female, estrogen receptor negative (CMS-HCC)   ??? Epidermolysis bullosa       Reviewed and up to date in Epic.    Appropriateness of Therapy     Acute infections noted within Epic:  No active infections  Patient reported infection: None    Is medication and dose appropriate based on diagnosis and infection status? Yes    Prescription has been clinically reviewed: Yes      Baseline Quality of Life Assessment      How many days over the past month did your condition  keep you from your normal activities? For example, brushing your teeth or getting up in the morning. 0    Financial Information     Medication Assistance provided: None Required    Anticipated copay of $0 reviewed with patient. Verified delivery address.    Delivery Information Scheduled delivery date: 08/31/21    Expected start date: 09/03/21    Medication will be delivered via UPS to the prescription address in [facility].  This shipment will not require a signature.      Explained the services we provide at [facility] and that each month we would call to set up refills.  Stressed importance of returning phone calls so that we could ensure they receive their medications in time each month.  Informed patient that we should be setting up refills 7-10 days prior to when they will run out of medication.  A pharmacist will reach out to perform a clinical assessment periodically.  Informed patient that a welcome packet, containing information about our pharmacy and other support services, a Notice of Privacy Practices, and a drug information handout will be sent.      The patient or caregiver noted above participated in the development of this care plan and knows that they can request review of or adjustments to the care plan at any time.      Patient or caregiver verbalized understanding of the above information as well as how to contact the pharmacy at [phone] option 4 with any questions/concerns.  The pharmacy is open Monday through Friday 8:30am-4:30pm.  A pharmacist is available 24/7 via pager to answer any clinical questions they may have.    Patient Specific Needs     - Does the patient have any physical, cognitive, or cultural barriers? No    - Does the patient have adequate living arrangements? (i.e. the ability to store and take their medication appropriately) Yes    - Did you identify any home environmental safety or security hazards? No    - Patient prefers to have medications discussed with  Patient     - Is the patient or caregiver able to read and understand education materials at a high school level or above? Yes    - Patient's primary language is  English     - Is the patient high risk? No    SOCIAL DETERMINANTS OF HEALTH     At the [facility], we have learned that life circumstances - like trouble affording food, housing, utilities, or transportation can affect the health of many of our patients.   That is why we wanted to ask: are you currently experiencing any life circumstances that are negatively impacting your health and/or quality of life? No    Social Determinants of Health     Financial Resource Strain: Not on file   Internet Connectivity: Not on file   Food Insecurity: Not on file   Tobacco Use: Medium Risk (08/19/2021)    Patient History    ??? Smoking Tobacco Use: Former    ??? Smokeless Tobacco Use: Never    ??? Passive Exposure: Not on file   Housing/Utilities: Not on file   Alcohol Use: Not on file   Transportation Needs: Not on file   Substance Use: Not on file   Health Literacy: Not on file   Physical Activity: Not on file   Interpersonal Safety: Not on file   Stress: Not on file   Intimate Partner Violence: Not on file   Depression: Not on file   Social Connections: Not on file       Would you be willing to receive help with any of the needs that you have identified today? No       Florene Route [facility] Specialty Pharmacist

## 2021-08-30 NOTE — Unmapped (Signed)
[facility] Specialty Medication Onboarding    Specialty Medication: Nivestym  Prior Authorization: Not Required   Financial Assistance: Yes - copay card approved as secondary   Final Copay/Day Supply: $0 / 21    Insurance Restrictions: None     Notes to Pharmacist:     The triage team has completed the benefits investigation and has determined that the patient is able to fill this medication at [facility]. Please contact the patient to complete the onboarding or follow up with the prescribing physician as needed.

## 2021-08-30 NOTE — Unmapped (Unsigned)
Pharmacist medication counseling    Patient Information: Tara Lee is a 36 y.o. female with recurrent breast cancer and chemotherapy-induced neutropenia who I have counseled prior to the initiation of filgrastim (NIvestym) by self-administration.     I reviewed that filgrastim is a subcutaneous injection administered every 24 hours for 3 days starting 24 hours post-chemotherapy for prevention of chemotherapy-induced neutropenia.  This medication should be kept refrigerated but can be removed from the fridge 30 minutes prior to administration to decrease injection site pain.      Medication Acquisition:  PA approved and will be delivered by the [facility].    Patient verbalized understanding of the above information.  Approximate time spent with patient: 5 minutes

## 2021-09-02 ENCOUNTER — Ambulatory Visit: Admit: 2021-09-02 | Discharge: 2021-09-03 | Payer: PRIVATE HEALTH INSURANCE

## 2021-09-02 ENCOUNTER — Institutional Professional Consult (permissible substitution): Admit: 2021-09-02 | Discharge: 2021-09-03 | Payer: PRIVATE HEALTH INSURANCE

## 2021-09-02 DIAGNOSIS — D701 Agranulocytosis secondary to cancer chemotherapy: Principal | ICD-10-CM

## 2021-09-02 DIAGNOSIS — Z171 Estrogen receptor negative status [ER-]: Principal | ICD-10-CM

## 2021-09-02 DIAGNOSIS — C50911 Malignant neoplasm of unspecified site of right female breast: Principal | ICD-10-CM

## 2021-09-02 DIAGNOSIS — T451X5A Adverse effect of antineoplastic and immunosuppressive drugs, initial encounter: Principal | ICD-10-CM

## 2021-09-02 DIAGNOSIS — C50011 Malignant neoplasm of nipple and areola, right female breast: Principal | ICD-10-CM

## 2021-09-02 LAB — CBC W/ AUTO DIFF
BASOPHILS ABSOLUTE COUNT: 0 10*9/L (ref 0.0–0.1)
BASOPHILS RELATIVE PERCENT: 1 %
EOSINOPHILS ABSOLUTE COUNT: 0 10*9/L (ref 0.0–0.5)
EOSINOPHILS RELATIVE PERCENT: 1 %
HEMATOCRIT: 32.7 % — ABNORMAL LOW (ref 34.0–44.0)
HEMOGLOBIN: 10.5 g/dL — ABNORMAL LOW (ref 11.3–14.9)
LYMPHOCYTES ABSOLUTE COUNT: 1.4 10*9/L (ref 1.1–3.6)
LYMPHOCYTES RELATIVE PERCENT: 54.7 %
MEAN CORPUSCULAR HEMOGLOBIN CONC: 32.2 g/dL (ref 32.0–36.0)
MEAN CORPUSCULAR HEMOGLOBIN: 23.3 pg — ABNORMAL LOW (ref 25.9–32.4)
MEAN CORPUSCULAR VOLUME: 72.3 fL — ABNORMAL LOW (ref 77.6–95.7)
MEAN PLATELET VOLUME: 6.8 fL (ref 6.8–10.7)
MONOCYTES ABSOLUTE COUNT: 0.3 10*9/L (ref 0.3–0.8)
MONOCYTES RELATIVE PERCENT: 10.5 %
NEUTROPHILS ABSOLUTE COUNT: 0.8 10*9/L — ABNORMAL LOW (ref 1.8–7.8)
NEUTROPHILS RELATIVE PERCENT: 32.8 %
NUCLEATED RED BLOOD CELLS: 0 /100{WBCs} (ref <=4)
PLATELET COUNT: 196 10*9/L (ref 150–450)
RED BLOOD CELL COUNT: 4.52 10*12/L (ref 3.95–5.13)
RED CELL DISTRIBUTION WIDTH: 15.8 % — ABNORMAL HIGH (ref 12.2–15.2)
WBC ADJUSTED: 2.6 10*9/L — ABNORMAL LOW (ref 3.6–11.2)

## 2021-09-02 LAB — COMPREHENSIVE METABOLIC PANEL
ALBUMIN: 3.6 g/dL (ref 3.4–5.0)
ALKALINE PHOSPHATASE: 71 U/L (ref 46–116)
ALT (SGPT): 25 U/L (ref 10–49)
ANION GAP: 7 mmol/L (ref 5–14)
AST (SGOT): 16 U/L (ref <=34)
BILIRUBIN TOTAL: 0.4 mg/dL (ref 0.3–1.2)
BLOOD UREA NITROGEN: 9 mg/dL (ref 9–23)
BUN / CREAT RATIO: 13
CALCIUM: 9.4 mg/dL (ref 8.7–10.4)
CHLORIDE: 107 mmol/L (ref 98–107)
CO2: 26.3 mmol/L (ref 20.0–31.0)
CREATININE: 0.71 mg/dL
EGFR CKD-EPI (2021) FEMALE: >90 mL/min/{1.73_m2} (ref >=60)
GLUCOSE RANDOM: 102 mg/dL (ref 70–179)
POTASSIUM: 4.2 mmol/L (ref 3.4–4.8)
PROTEIN TOTAL: 6.9 g/dL (ref 5.7–8.2)
SODIUM: 140 mmol/L (ref 135–145)

## 2021-09-02 LAB — TSH: THYROID STIMULATING HORMONE: 1.508 u[IU]/mL (ref 0.550–4.780)

## 2021-09-02 LAB — T4, FREE: FREE T4: 0.87 ng/dL — ABNORMAL LOW (ref 0.89–1.76)

## 2021-09-02 MED ADMIN — heparin, porcine (PF) 100 unit/mL injection 500 Units: 500 [IU] | INTRAVENOUS | @ 15:00:00 | Stop: 2021-09-02

## 2021-09-08 DIAGNOSIS — Z171 Estrogen receptor negative status [ER-]: Principal | ICD-10-CM

## 2021-09-08 DIAGNOSIS — C50911 Malignant neoplasm of unspecified site of right female breast: Principal | ICD-10-CM

## 2021-09-08 DIAGNOSIS — Q819 Epidermolysis bullosa, unspecified: Principal | ICD-10-CM

## 2021-09-08 MED ORDER — SSD 1 % TOPICAL CREAM
1 refills | 0 days
Start: 2021-09-08 — End: ?

## 2021-09-09 ENCOUNTER — Ambulatory Visit: Admit: 2021-09-09 | Discharge: 2021-09-10 | Payer: PRIVATE HEALTH INSURANCE

## 2021-09-09 DIAGNOSIS — Z171 Estrogen receptor negative status [ER-]: Principal | ICD-10-CM

## 2021-09-09 DIAGNOSIS — C50011 Malignant neoplasm of nipple and areola, right female breast: Principal | ICD-10-CM

## 2021-09-09 DIAGNOSIS — C50911 Malignant neoplasm of unspecified site of right female breast: Principal | ICD-10-CM

## 2021-09-09 LAB — CBC W/ AUTO DIFF
BASOPHILS ABSOLUTE COUNT: 0 10*9/L (ref 0.0–0.1)
BASOPHILS RELATIVE PERCENT: 1.2 %
EOSINOPHILS ABSOLUTE COUNT: 0 10*9/L (ref 0.0–0.5)
EOSINOPHILS RELATIVE PERCENT: 1.1 %
HEMATOCRIT: 33.1 % — ABNORMAL LOW (ref 34.0–44.0)
HEMOGLOBIN: 10.5 g/dL — ABNORMAL LOW (ref 11.3–14.9)
LYMPHOCYTES ABSOLUTE COUNT: 1.4 10*9/L (ref 1.1–3.6)
LYMPHOCYTES RELATIVE PERCENT: 43.2 %
MEAN CORPUSCULAR HEMOGLOBIN CONC: 31.7 g/dL — ABNORMAL LOW (ref 32.0–36.0)
MEAN CORPUSCULAR HEMOGLOBIN: 23 pg — ABNORMAL LOW (ref 25.9–32.4)
MEAN CORPUSCULAR VOLUME: 72.3 fL — ABNORMAL LOW (ref 77.6–95.7)
MEAN PLATELET VOLUME: 7.4 fL (ref 6.8–10.7)
MONOCYTES ABSOLUTE COUNT: 0.4 10*9/L (ref 0.3–0.8)
MONOCYTES RELATIVE PERCENT: 12.5 %
NEUTROPHILS ABSOLUTE COUNT: 1.3 10*9/L — ABNORMAL LOW (ref 1.8–7.8)
NEUTROPHILS RELATIVE PERCENT: 42 %
NUCLEATED RED BLOOD CELLS: 0 /100{WBCs} (ref <=4)
PLATELET COUNT: 269 10*9/L (ref 150–450)
RED BLOOD CELL COUNT: 4.57 10*12/L (ref 3.95–5.13)
RED CELL DISTRIBUTION WIDTH: 16.9 % — ABNORMAL HIGH (ref 12.2–15.2)
WBC ADJUSTED: 3.1 10*9/L — ABNORMAL LOW (ref 3.6–11.2)

## 2021-09-09 LAB — COMPREHENSIVE METABOLIC PANEL
ALBUMIN: 3.6 g/dL (ref 3.4–5.0)
ALKALINE PHOSPHATASE: 78 U/L (ref 46–116)
ALT (SGPT): 27 U/L (ref 10–49)
ANION GAP: 7 mmol/L (ref 5–14)
AST (SGOT): 16 U/L (ref <=34)
BILIRUBIN TOTAL: 0.4 mg/dL (ref 0.3–1.2)
BLOOD UREA NITROGEN: 9 mg/dL (ref 9–23)
BUN / CREAT RATIO: 11
CALCIUM: 9.8 mg/dL (ref 8.7–10.4)
CHLORIDE: 107 mmol/L (ref 98–107)
CO2: 26.3 mmol/L (ref 20.0–31.0)
CREATININE: 0.83 mg/dL — ABNORMAL HIGH
EGFR CKD-EPI (2021) FEMALE: >90 mL/min/{1.73_m2} (ref >=60)
GLUCOSE RANDOM: 103 mg/dL — ABNORMAL HIGH (ref 70–99)
POTASSIUM: 4 mmol/L (ref 3.4–4.8)
PROTEIN TOTAL: 7 g/dL (ref 5.7–8.2)
SODIUM: 140 mmol/L (ref 135–145)

## 2021-09-09 LAB — T4, FREE: FREE T4: 0.95 ng/dL (ref 0.89–1.76)

## 2021-09-09 LAB — TSH: THYROID STIMULATING HORMONE: 2.436 u[IU]/mL (ref 0.550–4.780)

## 2021-09-09 MED ORDER — SILVER SULFADIAZINE 1 % TOPICAL CREAM
1 refills | 0 days | Status: CP
Start: 2021-09-09 — End: ?

## 2021-09-09 MED ADMIN — fosaprepitant (EMEND) 150 mg in sodium chloride (NS) 0.9 % 100 mL IVPB: 150 mg | INTRAVENOUS | @ 13:00:00 | Stop: 2021-09-09

## 2021-09-09 MED ADMIN — dexAMETHasone (DECADRON) tablet 12 mg: 12 mg | ORAL | @ 13:00:00 | Stop: 2021-09-09

## 2021-09-09 MED ADMIN — ondansetron (ZOFRAN) tablet 24 mg: 24 mg | ORAL | @ 13:00:00 | Stop: 2021-09-09

## 2021-09-09 MED ADMIN — diphenhydrAMINE (BENADRYL) capsule/tablet 25 mg: 25 mg | ORAL | @ 13:00:00 | Stop: 2021-09-09

## 2021-09-09 MED ADMIN — pembrolizumab (KEYTRUDA) 200 mg in sodium chloride (NS) 0.9 % 50 mL IVPB: 200 mg | INTRAVENOUS | @ 14:00:00 | Stop: 2021-09-09

## 2021-09-09 MED ADMIN — heparin, porcine (PF) 100 unit/mL injection 500 Units: 500 [IU] | INTRAVENOUS | @ 16:00:00 | Stop: 2021-09-09

## 2021-09-09 MED ADMIN — PACLitaxel (TAXOL) 176.82 mg in sodium chloride NON-PVC (NS) 0.9 % 250 mL IVPB: 80 mg/m2 | INTRAVENOUS | @ 15:00:00 | Stop: 2021-09-09

## 2021-09-09 MED ADMIN — sodium chloride (NS) 0.9 % infusion: 100 mL/h | INTRAVENOUS | @ 13:00:00 | Stop: 2021-09-09

## 2021-09-09 MED ADMIN — CARBOplatin (PARAPLATIN) 750 mg IVPB: 750 mg | INTRAVENOUS | @ 16:00:00 | Stop: 2021-09-09

## 2021-09-09 MED ADMIN — famotidine (PF) (PEPCID) injection 20 mg: 20 mg | INTRAVENOUS | @ 13:00:00 | Stop: 2021-09-09

## 2021-09-09 NOTE — Unmapped (Signed)
Infusion on 09/09/2021   Component Date Value Ref Range Status    Sodium 09/09/2021 140  135 - 145 mmol/L Final    Potassium 09/09/2021 4.0  3.4 - 4.8 mmol/L Final    Chloride 09/09/2021 107  98 - 107 mmol/L Final    CO2 09/09/2021 26.3  20.0 - 31.0 mmol/L Final    Anion Gap 09/09/2021 7  5 - 14 mmol/L Final    BUN 09/09/2021 9  9 - 23 mg/dL Final    Creatinine 52/84/1324 0.83 (H)  0.60 - 0.80 mg/dL Final    BUN/Creatinine Ratio 09/09/2021 11   Final    eGFR CKD-EPI (2021) Female 09/09/2021 >90  >=60 mL/min/1.39m2 Final    eGFR calculated with CKD-EPI 2021 equation in accordance with [employer] and [employer] of Nephrology Task Force recommendations.    Glucose 09/09/2021 103 (H)  70 - 99 mg/dL Final    Calcium 40/10/2723 9.8  8.7 - 10.4 mg/dL Final    Albumin 36/64/4034 3.6  3.4 - 5.0 g/dL Final    Total Protein 09/09/2021 7.0  5.7 - 8.2 g/dL Final    Total Bilirubin 09/09/2021 0.4  0.3 - 1.2 mg/dL Final    AST 74/25/9563 16  <=34 U/L Final    ALT 09/09/2021 27  10 - 49 U/L Final    Alkaline Phosphatase 09/09/2021 78  46 - 116 U/L Final    TSH 09/09/2021 2.436  0.550 - 4.780 uIU/mL Final    Free T4 09/09/2021 0.95  0.89 - 1.76 ng/dL Final    WBC 87/56/4332 3.1 (L)  3.6 - 11.2 10*9/L Final    RBC 09/09/2021 4.57  3.95 - 5.13 10*12/L Final    HGB 09/09/2021 10.5 (L)  11.3 - 14.9 g/dL Final    HCT 95/18/8416 33.1 (L)  34.0 - 44.0 % Final    MCV 09/09/2021 72.3 (L)  77.6 - 95.7 fL Final    MCH 09/09/2021 23.0 (L)  25.9 - 32.4 pg Final    MCHC 09/09/2021 31.7 (L)  32.0 - 36.0 g/dL Final    RDW 60/63/0160 16.9 (H)  12.2 - 15.2 % Final    MPV 09/09/2021 7.4  6.8 - 10.7 fL Final    Platelet 09/09/2021 269  150 - 450 10*9/L Final    nRBC 09/09/2021 0  <=4 /100 WBCs Final    Neutrophils % 09/09/2021 42.0  % Final    Lymphocytes % 09/09/2021 43.2  % Final    Monocytes % 09/09/2021 12.5  % Final    Eosinophils % 09/09/2021 1.1  % Final    Basophils % 09/09/2021 1.2  % Final    Absolute Neutrophils 09/09/2021 1.3 (L)  1.8 - 7.8 10*9/L Final    Absolute Lymphocytes 09/09/2021 1.4  1.1 - 3.6 10*9/L Final    Absolute Monocytes 09/09/2021 0.4  0.3 - 0.8 10*9/L Final    Absolute Eosinophils 09/09/2021 0.0  0.0 - 0.5 10*9/L Final    Absolute Basophils 09/09/2021 0.0  0.0 - 0.1 10*9/L Final    Microcytosis 09/09/2021 Slight (A)  Not Present Final

## 2021-09-09 NOTE — Unmapped (Signed)
Patient arrived to infusion clinic at 0740am accompanied by mother alert and oriented x 3.  Ambulated with steady gait  .  Current weight obtained and vital signs measured.    Pt reports the following adverse effects after last infusion- denies rash,  reports a blister to inner R arm that started approx 1 week  ago.  It may be related to treatment but I am not sure.  Denies diarrhea, HA, SOB.   Port to left chest accessed using sterile technique,CHG cleansing of insertion site for greater than 30 seconds.  20g 3/4 in huber needle.  Flushed easily with 10 ml normal saline using pulsatile method, blood return verified.  Clear dressing applied.  Pt denies pain, swelling or irritation with flushing of Port.  Parameters for infusion in treatment plan reviewed and dose calculation using weight from today verified to be within 10% of current dose.  Pt reports that she has the GCSF injection and she is able to give injection.  She reports she was taught with the last treatment.   My sister is going to do it  88- pt updated about delay in drug delivery due to conversation about dose reduction or not.  Infusion completed without report of complication or additional side effects.    At the completion of the infusion port flushed with 10 ml normal saline using pulsatile method.  Good blood return noted. Flushed with Heparin per MD orders. Huber needle  removed. Bandaid applied to site.      Discharged ambulatory

## 2021-09-09 NOTE — Unmapped (Signed)
09/09/21 confirmed 10/13 with Tiajuana @ 11:55.

## 2021-09-15 MED ORDER — ONDANSETRON HCL 8 MG TABLET
ORAL_TABLET | Freq: Two times a day (BID) | ORAL | 5 refills | 15 days
Start: 2021-09-15 — End: ?

## 2021-09-16 ENCOUNTER — Ambulatory Visit: Admit: 2021-09-16 | Discharge: 2021-09-17 | Payer: PRIVATE HEALTH INSURANCE

## 2021-09-16 DIAGNOSIS — Z171 Estrogen receptor negative status [ER-]: Principal | ICD-10-CM

## 2021-09-16 DIAGNOSIS — C50011 Malignant neoplasm of nipple and areola, right female breast: Principal | ICD-10-CM

## 2021-09-16 DIAGNOSIS — C50911 Malignant neoplasm of unspecified site of right female breast: Principal | ICD-10-CM

## 2021-09-16 LAB — CBC W/ AUTO DIFF
BASOPHILS ABSOLUTE COUNT: 0 10*9/L (ref 0.0–0.1)
BASOPHILS RELATIVE PERCENT: 0.4 %
EOSINOPHILS ABSOLUTE COUNT: 0 10*9/L (ref 0.0–0.5)
EOSINOPHILS RELATIVE PERCENT: 0.9 %
HEMATOCRIT: 31.6 % — ABNORMAL LOW (ref 34.0–44.0)
HEMOGLOBIN: 10.2 g/dL — ABNORMAL LOW (ref 11.3–14.9)
LYMPHOCYTES ABSOLUTE COUNT: 1.5 10*9/L (ref 1.1–3.6)
LYMPHOCYTES RELATIVE PERCENT: 43.1 %
MEAN CORPUSCULAR HEMOGLOBIN CONC: 32.2 g/dL (ref 32.0–36.0)
MEAN CORPUSCULAR HEMOGLOBIN: 23.4 pg — ABNORMAL LOW (ref 25.9–32.4)
MEAN CORPUSCULAR VOLUME: 72.4 fL — ABNORMAL LOW (ref 77.6–95.7)
MEAN PLATELET VOLUME: 7.6 fL (ref 6.8–10.7)
MONOCYTES ABSOLUTE COUNT: 0.6 10*9/L (ref 0.3–0.8)
MONOCYTES RELATIVE PERCENT: 16.4 %
NEUTROPHILS ABSOLUTE COUNT: 1.4 10*9/L — ABNORMAL LOW (ref 1.8–7.8)
NEUTROPHILS RELATIVE PERCENT: 39.2 %
NUCLEATED RED BLOOD CELLS: 0 /100{WBCs} (ref <=4)
PLATELET COUNT: 245 10*9/L (ref 150–450)
RED BLOOD CELL COUNT: 4.37 10*12/L (ref 3.95–5.13)
RED CELL DISTRIBUTION WIDTH: 16.8 % — ABNORMAL HIGH (ref 12.2–15.2)
WBC ADJUSTED: 3.5 10*9/L — ABNORMAL LOW (ref 3.6–11.2)

## 2021-09-16 LAB — COMPREHENSIVE METABOLIC PANEL
ALBUMIN: 3.5 g/dL (ref 3.4–5.0)
ALKALINE PHOSPHATASE: 86 U/L (ref 46–116)
ALT (SGPT): 31 U/L (ref 10–49)
ANION GAP: 6 mmol/L (ref 5–14)
AST (SGOT): 14 U/L (ref <=34)
BILIRUBIN TOTAL: 0.2 mg/dL — ABNORMAL LOW (ref 0.3–1.2)
BLOOD UREA NITROGEN: 8 mg/dL — ABNORMAL LOW (ref 9–23)
BUN / CREAT RATIO: 11
CALCIUM: 9.2 mg/dL (ref 8.7–10.4)
CHLORIDE: 108 mmol/L — ABNORMAL HIGH (ref 98–107)
CO2: 27.6 mmol/L (ref 20.0–31.0)
CREATININE: 0.74 mg/dL
EGFR CKD-EPI (2021) FEMALE: >90 mL/min/{1.73_m2} (ref >=60)
GLUCOSE RANDOM: 101 mg/dL (ref 70–179)
POTASSIUM: 4.2 mmol/L (ref 3.4–4.8)
PROTEIN TOTAL: 6.5 g/dL (ref 5.7–8.2)
SODIUM: 142 mmol/L (ref 135–145)

## 2021-09-16 MED ADMIN — famotidine (PF) (PEPCID) injection 20 mg: 20 mg | INTRAVENOUS | @ 17:00:00 | Stop: 2021-09-16

## 2021-09-16 MED ADMIN — PACLitaxel (TAXOL) 176.82 mg in sodium chloride NON-PVC (NS) 0.9 % 250 mL IVPB: 80 mg/m2 | INTRAVENOUS | @ 17:00:00 | Stop: 2021-09-16

## 2021-09-16 MED ADMIN — diphenhydrAMINE (BENADRYL) capsule/tablet 25 mg: 25 mg | ORAL | @ 17:00:00 | Stop: 2021-09-16

## 2021-09-16 MED ADMIN — dexAMETHasone (DECADRON) tablet 20 mg: 20 mg | ORAL | @ 17:00:00 | Stop: 2021-09-16

## 2021-09-16 MED ADMIN — sodium chloride (NS) 0.9 % infusion: 100 mL/h | INTRAVENOUS | @ 17:00:00 | Stop: 2021-09-16

## 2021-09-16 MED ADMIN — heparin, porcine (PF) 100 unit/mL injection 500 Units: 500 [IU] | INTRAVENOUS | @ 18:00:00 | Stop: 2021-09-16

## 2021-09-16 NOTE — Unmapped (Signed)
Patient arrived in the infusion clinic at 11:40am.  Weight and Vitals were obtained. Port was accessed, flushed with blood return, dressing clean, dry, and intact.  Labs were drawn during provider visit and resulted within treatment plan parameters.  Patient was administered premedications at 12:31pm.  Patient was administered chemotherapy regiment as ordered without complications while in the clinic.  Port was heparin flushed and de-accessed, covered with 2x2 gauze and bandaid. Patient declined after visit summary then discharged home to self care.

## 2021-09-16 NOTE — Unmapped (Signed)
Patient has refills

## 2021-09-21 NOTE — Unmapped (Signed)
[facility] Clinical Assessment & Refill Coordination Note    Tara Lee, DOB: [birth year]  Phone: [phone] (home) [phone] (work)    All above HIPAA information was verified with patient.     Was a Nurse, learning disability used for this call? No    Specialty Medication(s):   Hematology/Oncology: Nivestym     Current Outpatient Medications   Medication Sig Dispense Refill   ??? chlorhexidine (HIBICLENS) 4 % external liquid Apply topically daily as needed. 473 mL 12   ??? ferrous sulfate 325 (65 FE) MG tablet Take 1 tablet (325 mg total) by mouth in the morning. 30 tablet 5   ??? filgrastim-aafi (NIVESTYM) 480 mcg/0.8 mL Syrg injection syringe Inject the contents of 1 syringe (480 mcg total) under the skin daily for 3 days starting 24 hours after carboplatin chemo. Cycle is every 21 days. 2.4 mL 1   ??? ondansetron (ZOFRAN) 8 MG tablet Take 1 tablet (8 mg total) by mouth two (2) times a day. Take 8 mg twice a day on days 2, 3, and 4. AND then, 8 mg every 8 hours as needed. 30 tablet 5   ??? prochlorperazine (COMPAZINE) 10 MG tablet Take 1 tablet (10 mg total) by mouth every six (6) hours as needed (Nausea/Vomiting). 30 tablet 2   ??? silver sulfaDIAZINE (SSD) 1 % cream APPLY TOPICALLY TO THE AFFECTED AREA 1 TO 2 TIMES DAILY 50 g 1     No current facility-administered medications for this visit.        Changes to medications: Tara Lee reports no changes at this time.    No Known Allergies    Changes to allergies: No    SPECIALTY MEDICATION ADHERENCE     Nivestym 480 mcg/0.82ml: 0 days of medicine on hand   Medication Adherence    Patient reported X missed doses in the last month: 0  Specialty Medication: Nivestym                      Specialty medication(s) dose(s) confirmed: Regimen is correct and unchanged.     Are there any concerns with adherence? No    Adherence counseling provided? Not needed    CLINICAL MANAGEMENT AND INTERVENTION      Clinical Benefit Assessment:    Do you feel the medicine is effective or helping your condition? Yes    Clinical Benefit counseling provided? Not needed    Adverse Effects Assessment:    Are you experiencing any side effects? No    Are you experiencing difficulty administering your medicine? No    Quality of Life Assessment:    Quality of Life      Oncology  1. What impact has your specialty medication had on the reduction of your daily pain or discomfort level?: None  2. On a scale of 1-10, how would you rate your ability to manage side effects associated with your specialty medication? (1=no issues, 10 = unable to take medication due to side effects): 1          How many days over the past month did your condition/medication  keep you from your normal activities? For example, brushing your teeth or getting up in the morning. 0    Have you discussed this with your provider? Not needed    Acute Infection Status:    Acute infections noted within Epic:  No active infections  Patient reported infection: None    Therapy Appropriateness:  Is therapy appropriate and patient progressing towards therapeutic goals? Yes, therapy is appropriate and should be continued    DISEASE/MEDICATION-SPECIFIC INFORMATION      For patients on injectable medications: Patient currently has 0 doses left.  Next injection is scheduled for 10/01/21.    PATIENT SPECIFIC NEEDS     - Does the patient have any physical, cognitive, or cultural barriers? No    - Is the patient high risk? No    - Does the patient require a Care Management Plan? No     SOCIAL DETERMINANTS OF HEALTH     At the [facility], we have learned that life circumstances - like trouble affording food, housing, utilities, or transportation can affect the health of many of our patients.   That is why we wanted to ask: are you currently experiencing any life circumstances that are negatively impacting your health and/or quality of life? No    Social Determinants of Health     Financial Resource Strain: Not on file Internet Connectivity: Not on file   Food Insecurity: Not on file   Tobacco Use: Medium Risk (09/02/2021)    Patient History    ??? Smoking Tobacco Use: Former    ??? Smokeless Tobacco Use: Never    ??? Passive Exposure: Not on file   Housing/Utilities: Not on file   Alcohol Use: Not on file   Transportation Needs: Not on file   Substance Use: Not on file   Health Literacy: Not on file   Physical Activity: Not on file   Interpersonal Safety: Not on file   Stress: Not on file   Intimate Partner Violence: Not on file   Depression: Not on file   Social Connections: Not on file       Would you be willing to receive help with any of the needs that you have identified today? No       SHIPPING     Specialty Medication(s) to be Shipped:   Hematology/Oncology: Nivestym     Other medication(s) to be shipped: No additional medications requested for fill at this time     Changes to insurance: No    Delivery Scheduled: Yes, Expected medication delivery date: 09/27/21.     Medication will be delivered via UPS to the confirmed prescription address in [facility].    The patient will receive a drug information handout for each medication shipped and additional FDA Medication Guides as required.  Verified that patient has previously received a Conservation officer, historic buildings and a Surveyor, mining.    The patient or caregiver noted above participated in the development of this care plan and knows that they can request review of or adjustments to the care plan at any time.      All of the patient's questions and concerns have been addressed.    Rollen Sox   [facility] Specialty Pharmacist

## 2021-09-23 ENCOUNTER — Ambulatory Visit: Admit: 2021-09-23 | Discharge: 2021-09-23 | Payer: PRIVATE HEALTH INSURANCE

## 2021-09-23 ENCOUNTER — Institutional Professional Consult (permissible substitution): Admit: 2021-09-23 | Discharge: 2021-09-23 | Payer: PRIVATE HEALTH INSURANCE

## 2021-09-23 DIAGNOSIS — R432 Parageusia: Principal | ICD-10-CM

## 2021-09-23 DIAGNOSIS — Z5111 Encounter for antineoplastic chemotherapy: Principal | ICD-10-CM

## 2021-09-23 DIAGNOSIS — Z171 Estrogen receptor negative status [ER-]: Principal | ICD-10-CM

## 2021-09-23 DIAGNOSIS — C50911 Malignant neoplasm of unspecified site of right female breast: Principal | ICD-10-CM

## 2021-09-23 DIAGNOSIS — C50011 Malignant neoplasm of nipple and areola, right female breast: Principal | ICD-10-CM

## 2021-09-23 LAB — COMPREHENSIVE METABOLIC PANEL
ALBUMIN: 3.4 g/dL (ref 3.4–5.0)
ALKALINE PHOSPHATASE: 72 U/L (ref 46–116)
ALT (SGPT): 21 U/L (ref 10–49)
ANION GAP: 7 mmol/L (ref 5–14)
AST (SGOT): 14 U/L (ref <=34)
BILIRUBIN TOTAL: 0.3 mg/dL (ref 0.3–1.2)
BLOOD UREA NITROGEN: 6 mg/dL — ABNORMAL LOW (ref 9–23)
BUN / CREAT RATIO: 7
CALCIUM: 9.2 mg/dL (ref 8.7–10.4)
CHLORIDE: 108 mmol/L — ABNORMAL HIGH (ref 98–107)
CO2: 26.4 mmol/L (ref 20.0–31.0)
CREATININE: 0.81 mg/dL — ABNORMAL HIGH
EGFR CKD-EPI (2021) FEMALE: >90 mL/min/{1.73_m2} (ref >=60)
GLUCOSE RANDOM: 101 mg/dL (ref 70–179)
POTASSIUM: 4.1 mmol/L (ref 3.4–4.8)
PROTEIN TOTAL: 6.7 g/dL (ref 5.7–8.2)
SODIUM: 141 mmol/L (ref 135–145)

## 2021-09-23 LAB — CBC W/ AUTO DIFF
BASOPHILS ABSOLUTE COUNT: 0 10*9/L (ref 0.0–0.1)
BASOPHILS RELATIVE PERCENT: 1 %
EOSINOPHILS ABSOLUTE COUNT: 0 10*9/L (ref 0.0–0.5)
EOSINOPHILS RELATIVE PERCENT: 1 %
HEMATOCRIT: 30 % — ABNORMAL LOW (ref 34.0–44.0)
HEMOGLOBIN: 9.6 g/dL — ABNORMAL LOW (ref 11.3–14.9)
LYMPHOCYTES ABSOLUTE COUNT: 1.4 10*9/L (ref 1.1–3.6)
LYMPHOCYTES RELATIVE PERCENT: 46 %
MEAN CORPUSCULAR HEMOGLOBIN CONC: 31.8 g/dL — ABNORMAL LOW (ref 32.0–36.0)
MEAN CORPUSCULAR HEMOGLOBIN: 23.2 pg — ABNORMAL LOW (ref 25.9–32.4)
MEAN CORPUSCULAR VOLUME: 73 fL — ABNORMAL LOW (ref 77.6–95.7)
MEAN PLATELET VOLUME: 7.5 fL (ref 6.8–10.7)
MONOCYTES ABSOLUTE COUNT: 0.3 10*9/L (ref 0.3–0.8)
MONOCYTES RELATIVE PERCENT: 8.5 %
NEUTROPHILS ABSOLUTE COUNT: 1.3 10*9/L — ABNORMAL LOW (ref 1.8–7.8)
NEUTROPHILS RELATIVE PERCENT: 43.5 %
NUCLEATED RED BLOOD CELLS: 0 /100{WBCs} (ref <=4)
PLATELET COUNT: 229 10*9/L (ref 150–450)
RED BLOOD CELL COUNT: 4.12 10*12/L (ref 3.95–5.13)
RED CELL DISTRIBUTION WIDTH: 16.6 % — ABNORMAL HIGH (ref 12.2–15.2)
WBC ADJUSTED: 3 10*9/L — ABNORMAL LOW (ref 3.6–11.2)

## 2021-09-23 MED ADMIN — dexAMETHasone (DECADRON) tablet 20 mg: 20 mg | ORAL | @ 15:00:00 | Stop: 2021-09-23

## 2021-09-23 MED ADMIN — famotidine (PF) (PEPCID) injection 20 mg: 20 mg | INTRAVENOUS | @ 15:00:00 | Stop: 2021-09-23

## 2021-09-23 MED ADMIN — diphenhydrAMINE (BENADRYL) capsule/tablet 25 mg: 25 mg | ORAL | @ 15:00:00 | Stop: 2021-09-23

## 2021-09-23 MED ADMIN — PACLitaxel (TAXOL) 176.82 mg in sodium chloride NON-PVC (NS) 0.9 % 250 mL IVPB: 80 mg/m2 | INTRAVENOUS | @ 16:00:00 | Stop: 2021-09-23

## 2021-09-23 MED ADMIN — heparin, porcine (PF) 100 unit/mL injection 500 Units: 500 [IU] | INTRAVENOUS | @ 13:00:00 | Stop: 2021-09-23

## 2021-09-23 MED ADMIN — heparin, porcine (PF) 100 unit/mL injection 500 Units: 500 [IU] | INTRAVENOUS | @ 17:00:00 | Stop: 2021-09-23

## 2021-09-23 MED ADMIN — sodium chloride (NS) 0.9 % infusion: 100 mL/h | INTRAVENOUS | @ 16:00:00 | Stop: 2021-09-23

## 2021-09-23 NOTE — Unmapped (Signed)
Medical Oncology Return Patient Note    Patient Name: Tara Lee  Patient Age: 36 y.o.  Encounter Date: 09/23/2021    PCP: Mont Dutton, MD    Cancer Team  Surgical Oncology: Sherilyn Cooter, DO  Surgical Oncology NP: Pollyann Samples, NP and Sonda Primes, NP  Radiation Oncology: Thom Chimes, MD and Konrad Penta, NP  Medical Oncology: Marc Morgans, MD  Plastic Surgery: None at this time    Genetics: [employer] Breast Cancer STAT panel with BRCA1/2, CDH1, PALB2, PTEN and TP53, on 04/01/2015, was negative.    Reason for visit:  Follow up for management of breast cancer.    Assessment/Plan    1. 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 Her staging CT and Bone Scan do not show evidence of metastatic disease.  There is a question of uptake in the sternum. This area was evaluated on breast MRI which shows that the mass extends within the medial dermis and posteriorly to the anterior fibers of the pectoralis muscle. She declined AC due to concerns about secondary hematologic malignancy. Port catheter placed 7/5.   --Initiated weekly Taxol + q3w carboplatin + pembrolizumab on 07/22/21 appointment.   --DR taxol to 80% 08/19/21 due to leukopenia and neutropenia. C2D15 taxol held and not made up, again for neutropenia. C3 D1 held due to ANC 800. Resumed weekly therapy 09/09/21 with addition of GCSF and this has been well tolerated.  --Continue daily GCSF during Carbo weeks only x 3 days starting 24h after chemo.  Advised Claritin 10mg  daily for 5-7 days during this time to help prevent bone pain.  Sister gives injections at home.  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  Would like to re involve Dermatology for support as she enters breast cancer treatment again.   - Ambulatory referral to Dermatology; Future  - 8/4 Rx silvadene cream to use PRN    Genetics: [employer] panel from March 2017 negative. Discussed updated genetic testing w/counselor on 6/29 but elected to defer testing at this time to focus on current treatment     Bone Health  DEXA 06/28/21: Asymmetric uptake in the sternum is of uncertain significance and has no CT correlate; recommend attention on follow-up. No other evidence of osseous metastatic disease.    Supportive care  - Referral to [facility] for additional support  - Declines Oncofertility referral, discussed ovarian suppression during chemo, she will consider.  - Is not interested in Dignicap, but will plan chemo weekly in [place].  - Taste alteration and decreased appetite after chemo. Discussed how to add protein into smoothies with whey protein or nut butters.  Doing well maintaining hydration.      Communication  - Patient prefers to receive clinical information by phone in between appointments versus myChart messages.    Dispo:  --Proceed today with  C3 D15 neoadjuvant chemotherapy today .   --RTC 1 week for Taxol + carboplatin + pembro w/ labs, and in 2 weeks for next provider visit for ongoing chemotherapy  --Plan repeat bilateral breast MRI, R mammo and ultrasound 10/14/21 + Dr. Dellis Anes visit the following week -- all requested today  --last chemo is tentatively planned for 10/14/21    -------------------------------------------------------------------------------  Interval hx/ROS:  Tara Lee presents today with her mother & her sister for ongoing management of her breast cancer.     Prior ROS:  --Feeling well. No PN symptoms.  No interval illness or fevers. Since addition of GCSF she has missed no chemo doses.  -- appetite is really poor after Carbo. +Taste alteration. 3 days of light eating - fruit, popsicles.  By day 3 -soup, day 4 - normal food.  --takes zofran BID scheduled x 3 days on Carbo weeks. Very rare compazine. Never vomits.  --Skin is doing well other than hyperpigmentation is lasting longer around areas of injury related to the Epidermolysis bullosa  --Denies mouth sores, SOB, cough  --Reports breast mass is palpably smaller  --Full 12 ROS reviewed and otherwise negative/none      History of Present Illness   Tara Lee is a 36 y.o. woman who presents today accompanied by her mother for medical oncology evaluation of a\n in-breast tumor recurrence of her previously diagnosed right breast cancer.     Previously she was dx 03/2015 with a cT2N1 triple negative breast cancer at age 89.  She received neoadjuvant Carbo/Taxol, but declined AC chemotherapy.  Denies any chemotherapy induced peripheral neuropathy symptoms from her prior chemotherapy.   --She underwent a R breast lumpectomy and ALND with residual 8mm tumor and 1/27 axillary lymph nodes involved with tumor, RCB II.  She completed right breast RT in 11/2015.  --Had negative genetic testing at this time.  --She has followed routinely with Konrad Penta, NP in Radiation Oncology for surveillance.  She was a former medical oncology patient of Dr. Archie Balboa.     06/06/21 Mammogram revealed a 2.4cm right breast mass at the prior lumpectomy side.    --Biopsy of the 3oc mass 3cm FN revealed gr3 IDCA, triple negative.  --She saw Dr. Dellis Anes who rec'd consideration of neoadjuvant chemotherapy.   --Staging CT, BS and breast MRI were completed. Question of uptake in sternum on bone scan. MRI showed that the mass extends within the medial dermis and posteriorly to the anterior fibers of the pectoralis muscle.    Her PMH is remarkable for Epidermolysis bullosa, diagnosed as a neonate by [facility]. Previously saw Dr. Inis Sizer at [facility] during her prior chemotherapy treatment, but tolerated chemotherapy, port placement, breast surgery and XRT very well without major skin toxicities.     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 She lives with her mother, since her chemo in 2017. Is not interested in childbearing.     Breast Cancer History  Oncology History Overview Note   2023: Recurrent Right breast cT2 cN0 IDC, G3 -/-/-  2017: Right breast ypT1b ypN35mic M0 IDC, G3, -/-/-       Malignant neoplasm of right breast in female, estrogen receptor negative (CMS-HCC)   02/23/2015 -  Presenting Symptoms    Presented to [facility] for evaluation of self detected right breast mass. Noticed bleeding, nipple discharge and blister after scratching an itchy area. Has a history of Epidermolysis bullosa.      03/03/2015 Initial Diagnosis    Malignant neoplasm of upper-inner quadrant of female breast [facility]): Clinical stage: At least IIB [cT2, cN1]     03/03/2015 Interval Scan(s)    MMG/US in [place]: 4.4 cm irregular mass in the right breast at 2:30 position with enlarged right axillary LN measuring 3.7 cm.     03/03/2015 Biopsy  Rt core 2:30: Gr 3, LVI+, IDC. ER (0), PR (0), HER-2 FISH neg (# 2.05, ratio 1.37) Ki-67 90%. No DCIS. Rt axillary LN: Positive for carcinoma, similar to carcinoma in breast core. No LN tissue. ER (0), PR (0), HER-2 FISH neg (#2.3, ratio 1.18) Ki-67 70%.     03/11/2015 Interval Scan(s)    MRI breast: R LIQ 4.7 x 4 x 4cm bx proven cancer, add'l 1.3cm spic mass 1.5cm ant to cancer, add'l 7mm mass superior (prob fibroadenoma), R ax lymphadenopathy, retropectoral LN, 1.4cm susp internal mam LN. L breast clear. Rec bx NME & mass if BCT possible     03/17/2015 -  Other    MDC rec: cT2N1 TN IDC. Neoadj chemo. Needs staging. Discussed epidermolysis bullosa, will get in with [facility] derm. Skin delicate and would likely not tolerate recon if mastectomy needed. Meeting genetics. Fertility discussion. Rec adj XRT. MRI int review.     03/24/2015 Interval Scan(s)    CT CAP: Heterogeneous right breast mass and right axillary adenopathy. Two subcentimeter hypodensities in the liver are indeterminate. Attention on followup. Otherwise, no evidence of metastatic disease in the abdomen or pelvis.     03/24/2015 Interval Scan(s)    Bone scan: No osseous mets.     03/24/2015 Genetics    Negative for BRCA1, BRCA2, CDH1, PALB2, PTEN, and TP53.     04/14/2015 Biopsy    MRI biopsy R breast NME: benign breast tissue, no atypia or carcinoma. Additional R breast mass biopsy: benign breast tissue with dense stromal fibrosis, no atypia or carcinoma.     04/15/2015 - 07/18/2015 Chemotherapy    Chemotherapy Treatment    Treatment Goal Curative   Line of Treatment [No plan line of treatment]   Plan Name OP Breast Carboplatin / weekly PACLitaxel   Start Date 04/15/2015   End Date 07/08/2015   Provider Olivia Mackie, MD   Chemotherapy dexamethasone (DECADRON) tablet 12 mg, 12 mg, Oral, Once, 4 of 4 cycles    CARBOplatin (PARAPLATIN) 900 mg IVPB, 900 mg (100 % of original dose 900 mg), Intravenous, Once, 4 of 4 cycles  Dose modification:   (original dose 900 mg, Cycle 1),   (original dose 900 mg, Cycle 2),   (original dose 900 mg, Cycle 3),   (original dose 900 mg, Cycle 4),   (original dose 900 mg, Cycle 4),   (original dose 900 mg, Cycle 4),   (original dose 900 mg, Cycle 4),   (original dose 900 mg, Cycle 4),   (original dose 900 mg, Cycle 4),   (original dose 900 mg, Cycle 4)    PACLItaxel (TAXOL) 166.38 mg in sodium chloride 0.9% NON-PVC (NS) 250 mL IVPB, 80 mg/m2 = 166.38 mg, Intravenous, Once, 4 of 4 cycles          08/10/2015 Surgery    Right partial mastectomy w ALND w ARM: residual 8mm of IDC, Gr 2, ER/PR/H2N negative, negative margins.  1/27 lymph nodes with metastatic disease, largest focus measuring 0.73mm, + LVI, No ECE.  RCB- II (2.174)        - 11/18/2015 Radiation     R breast and lymph nodes: 200 cGy x 25 = 5,000 cGy(Goal for IMN 95% at 45 Gy)  Boost to tumor bed: 200 cGy x 5 = 1000 cGY  Boost to IMN: 200 cGy x 8 = 1600 cGy with electrons     06/15/2021 Interval Scan(s) MMG/US: Right breast, 3:00 3 CFN, there is a 2.4 x 1.9 x  2.0 cm solid irregular mass.     06/16/2021 Recurrence    Right breast USG core biopsy: IDC, G3, ER-, PR-, Her2-(1+).     06/27/2021 -  Cancer Staged    Staging form: Breast, AJCC 8th Edition  - Clinical stage from 06/27/2021: Stage IIB (rcT2, cN0, cM0, G3, ER-, PR-, HER2-) - Signed by Rudie Meyer, ANP on 06/27/2021       06/29/2021 Tumor Board    MDC recs: History of right breast TNBC (ypT1b ypN61mi) s/p partial mastectomy and ALND. Now with recurrent right TNBC.  TC previously. On exam, this is tethered to the chest wall at the inframammary fold. Imaging review with it abutting, but does not look to be involving. Discussed consideration for NACT again. 2.7 cm on imaging, 4.5 cm on exam.   Genetics: prior limited GT (BRCA1, BRCA2, CDH1, PALB2, PTEN, TP53) that was negative in 2017. Recommend referral to genetics for updated assessment/discussion for additional GT.     07/22/2021 -  Chemotherapy    OP breast paclitaxel weekly/ carboplatin q3 weeks/ pembro q3 weeks  PACLitaxel 80 mg/m2 IV on Days 1, 8, and 15 / CARBOplatin AUC 1.5 IV on Days 1, 8, and 15 / Pembrolizumab 200 mg IV on Day 1  21-day cycle x 4 cycles    Followed by:  DOXOrubicin 60 mg/m2 IV on Day 1 / Cyclophosphamide 600 mg/m2 IV on Day 1 / Pembrolizumab 200 mg IV on Day 1  21-day cycle x 4 cycles           Past Medical History  Past Medical History:   Diagnosis Date    Breast cancer (CMS-HCC)     Right 2017    Epidermolysis bullosa        Reproductive History  Menarche: 11   G0 P0  LMP: 06/06/21  Current Contraception:   OCP use: no      Surgical History  Past Surgical History:   Procedure Laterality Date    BREAST BIOPSY Right     malignant    BREAST LUMPECTOMY Right 08/10/2015    CHEMOTHERAPY Right 03/2015    IR INSERT PORT AGE GREATER THAN 5 YRS  07/06/2021    IR INSERT PORT AGE GREATER THAN 5 YRS 07/06/2021 Dorene Ar, PA IMG [facility]    PR MASTECTOMY, PARTIAL Right 08/10/2015 Procedure: MASTECTOMY, PARTIAL (EG, LUMPECTOMY, TYLECTOMY, QUADRANTECTOMY, SEGMENTECTOMY);  Surgeon: Talbert Cage, DO;  Location: [facility];  Service: Surgical Oncology    PR REMOVE ARMPITS LYMPH NODES COMPLT Right 08/10/2015    Procedure: AXILLARY LYMPHADENECTOMY; COMPLETE;  Surgeon: Talbert Cage, DO;  Location: [facility];  Service: Surgical Oncology    RADIATION         Medications  Current Outpatient Medications   Medication Sig Dispense Refill    chlorhexidine (HIBICLENS) 4 % external liquid Apply topically daily as needed. 473 mL 12    ferrous sulfate 325 (65 FE) MG tablet Take 1 tablet (325 mg total) by mouth in the morning. 30 tablet 5    filgrastim-aafi (NIVESTYM) 480 mcg/0.8 mL Syrg injection syringe Inject the contents of 1 syringe (480 mcg total) under the skin daily for 3 days starting 24 hours after carboplatin chemo. Cycle is every 21 days. 2.4 mL 1    ondansetron (ZOFRAN) 8 MG tablet Take 1 tablet (8 mg total) by mouth two (2) times a day. Take 8 mg twice a day on days 2, 3, and 4. AND then, 8 mg  every 8 hours as needed. 30 tablet 5    prochlorperazine (COMPAZINE) 10 MG tablet Take 1 tablet (10 mg total) by mouth every six (6) hours as needed (Nausea/Vomiting). 30 tablet 2    silver sulfaDIAZINE (SSD) 1 % cream APPLY TOPICALLY TO THE AFFECTED AREA 1 TO 2 TIMES DAILY 50 g 1     No current facility-administered medications for this visit.     Facility-Administered Medications Ordered in Other Visits   Medication Dose Route Frequency Provider Last Rate Last Admin    heparin, porcine (PF) 100 unit/mL injection 500 Units  500 Units Intravenous Q30 Min PRN Willeen Niece, MD   500 Units at 09/23/21 1251    OKAY TO SEND MEDICATION/CHEMOTHERAPY TO OUTPATIENT UNIT   Other Once Willeen Niece, MD        sodium chloride (NS) 0.9 % infusion  100 mL/hr Intravenous Continuous Willeen Niece, MD   Stopped at 09/23/21 1250       Allergies  No Known Allergies    Personal and Social History  Social History     Social History Narrative    Lives in [place], [place]. She currently works at [employer]. Smoked half pack per day of cigarettes/cigars for 5 years when she was in college. Quit smoking a few years ago. She enjoys Bible dancing. She has strong spiritual beliefs and faith.       Family History  Mat grand with ovarian CA    Review of Systems: A complete review of systems was obtained including: Constitutional, Eyes, ENT, Cardiovascular, Respiratory, GI, GU, Musculoskeletal, Skin, Neurological, Psychiatric, Endocrine, Heme/Lymphatic, and Allergic/Immunologic systems. It is negative or non-contributory to the patient???s management except for the following: see interval history for details      Physical Exam  GENERAL: Well appearing female in no acute distress.  VITAL SIGNS: BP 144/90  - Pulse 88  - Temp 36.9 ??C (98.5 ??F) (Temporal)  - Resp 16  - Wt (!) 104.8 kg (231 lb 1.6 oz)  - BMI 38.46 kg/m??   HEENT: Normocephalic, symmetric. EOMI. Sclera clear.  LYMPH: no cervical, supraclavicular or axillary lymphadenopathy.  CHEST/BREASTS: There is a much smaller/flatter firm mass in the medial right breast at 3 o'clock 7 CFN at sternal border, measuring 1.0 x 2.0cm. Remainder of the bilateral breasts without mass, concerning skin change, retraction or nipple discharge.  No lymphedema noted.  LUNGS: respirations even and unlabored, clear to ausculation bilaterally.  CARDIAC: RRR, no murmurs.   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  Small wound lateral right breast -No evidence of infection  EXTREMITIES: No upper or lower extremity edema, normal skin tone.  MSK: Spine is nontender.  NEURO: Alert, oriented, normal gait & coordination.    Orders/Results  Reviewed in EMR

## 2021-09-23 NOTE — Unmapped (Signed)
Clinical Support on 09/23/2021   Component Date Value Ref Range Status    Sodium 09/23/2021 141  135 - 145 mmol/L Final    Potassium 09/23/2021 4.1  3.4 - 4.8 mmol/L Final    Chloride 09/23/2021 108 (H)  98 - 107 mmol/L Final    CO2 09/23/2021 26.4  20.0 - 31.0 mmol/L Final    Anion Gap 09/23/2021 7  5 - 14 mmol/L Final    BUN 09/23/2021 6 (L)  9 - 23 mg/dL Final    Creatinine 29/56/2130 0.81 (H)  0.60 - 0.80 mg/dL Final    BUN/Creatinine Ratio 09/23/2021 7   Final    eGFR CKD-EPI (2021) Female 09/23/2021 >90  >=60 mL/min/1.22m2 Final    eGFR calculated with CKD-EPI 2021 equation in accordance with [employer] and [employer] of Nephrology Task Force recommendations.    Glucose 09/23/2021 101  70 - 179 mg/dL Final    Calcium 86/57/8469 9.2  8.7 - 10.4 mg/dL Final    Albumin 62/95/2841 3.4  3.4 - 5.0 g/dL Final    Total Protein 09/23/2021 6.7  5.7 - 8.2 g/dL Final    Total Bilirubin 09/23/2021 0.3  0.3 - 1.2 mg/dL Final    AST 32/44/0102 14  <=34 U/L Final    ALT 09/23/2021 21  10 - 49 U/L Final    Alkaline Phosphatase 09/23/2021 72  46 - 116 U/L Final    WBC 09/23/2021 3.0 (L)  3.6 - 11.2 10*9/L Final    RBC 09/23/2021 4.12  3.95 - 5.13 10*12/L Final    HGB 09/23/2021 9.6 (L)  11.3 - 14.9 g/dL Final    HCT 72/53/6644 30.0 (L)  34.0 - 44.0 % Final    MCV 09/23/2021 73.0 (L)  77.6 - 95.7 fL Final    MCH 09/23/2021 23.2 (L)  25.9 - 32.4 pg Final    MCHC 09/23/2021 31.8 (L)  32.0 - 36.0 g/dL Final    RDW 03/47/4259 16.6 (H)  12.2 - 15.2 % Final    MPV 09/23/2021 7.5  6.8 - 10.7 fL Final    Platelet 09/23/2021 229  150 - 450 10*9/L Final    nRBC 09/23/2021 0  <=4 /100 WBCs Final    Neutrophils % 09/23/2021 43.5  % Final    Lymphocytes % 09/23/2021 46.0  % Final    Monocytes % 09/23/2021 8.5  % Final    Eosinophils % 09/23/2021 1.0  % Final    Basophils % 09/23/2021 1.0  % Final    Absolute Neutrophils 09/23/2021 1.3 (L)  1.8 - 7.8 10*9/L Final    Absolute Lymphocytes 09/23/2021 1.4  1.1 - 3.6 10*9/L Final    Absolute Monocytes 09/23/2021 0.3  0.3 - 0.8 10*9/L Final    Absolute Eosinophils 09/23/2021 0.0  0.0 - 0.5 10*9/L Final    Absolute Basophils 09/23/2021 0.0  0.0 - 0.1 10*9/L Final    Microcytosis 09/23/2021 Slight (A)  Not Present Final

## 2021-09-23 NOTE — Unmapped (Signed)
Patient arrived in the infusion clinic at 1110. Weight and vitals were obtained. Port was accessed during provider visit, RN flushed with blood return, dressing clean, dry and intact. Labs were drawn during provider visit and resulted within treatment plan parameters. Patient was administered premeds at 1115. Patient was administered chemotherapy regimen as ordered without complications while in the clinic. Pt port heparin flushed and deaccessed, covered with 2x2 gauze and bandaid. Patient given after visit summary then discharged home to self care.

## 2021-09-26 MED FILL — NIVESTYM 480 MCG/0.8 ML SUBCUTANEOUS SYRINGE: SUBCUTANEOUS | 21 days supply | Qty: 2.4 | Fill #1

## 2021-09-30 ENCOUNTER — Ambulatory Visit: Admit: 2021-09-30 | Discharge: 2021-10-01 | Payer: PRIVATE HEALTH INSURANCE

## 2021-09-30 DIAGNOSIS — C50911 Malignant neoplasm of unspecified site of right female breast: Principal | ICD-10-CM

## 2021-09-30 DIAGNOSIS — C50011 Malignant neoplasm of nipple and areola, right female breast: Principal | ICD-10-CM

## 2021-09-30 DIAGNOSIS — Z171 Estrogen receptor negative status [ER-]: Principal | ICD-10-CM

## 2021-09-30 LAB — CBC W/ AUTO DIFF
BASOPHILS ABSOLUTE COUNT: 0 10*9/L (ref 0.0–0.1)
BASOPHILS RELATIVE PERCENT: 0.5 %
EOSINOPHILS ABSOLUTE COUNT: 0 10*9/L (ref 0.0–0.5)
EOSINOPHILS RELATIVE PERCENT: 0.8 %
HEMATOCRIT: 29 % — ABNORMAL LOW (ref 34.0–44.0)
HEMOGLOBIN: 9.3 g/dL — ABNORMAL LOW (ref 11.3–14.9)
LYMPHOCYTES ABSOLUTE COUNT: 1.2 10*9/L (ref 1.1–3.6)
LYMPHOCYTES RELATIVE PERCENT: 34.6 %
MEAN CORPUSCULAR HEMOGLOBIN CONC: 32 g/dL (ref 32.0–36.0)
MEAN CORPUSCULAR HEMOGLOBIN: 23.4 pg — ABNORMAL LOW (ref 25.9–32.4)
MEAN CORPUSCULAR VOLUME: 73.1 fL — ABNORMAL LOW (ref 77.6–95.7)
MEAN PLATELET VOLUME: 6.9 fL (ref 6.8–10.7)
MONOCYTES ABSOLUTE COUNT: 0.3 10*9/L (ref 0.3–0.8)
MONOCYTES RELATIVE PERCENT: 7.4 %
NEUTROPHILS ABSOLUTE COUNT: 2 10*9/L (ref 1.8–7.8)
NEUTROPHILS RELATIVE PERCENT: 56.7 %
NUCLEATED RED BLOOD CELLS: 0 /100{WBCs} (ref <=4)
PLATELET COUNT: 199 10*9/L (ref 150–450)
RED BLOOD CELL COUNT: 3.96 10*12/L (ref 3.95–5.13)
RED CELL DISTRIBUTION WIDTH: 17.3 % — ABNORMAL HIGH (ref 12.2–15.2)
WBC ADJUSTED: 3.5 10*9/L — ABNORMAL LOW (ref 3.6–11.2)

## 2021-09-30 LAB — COMPREHENSIVE METABOLIC PANEL
ALBUMIN: 3.4 g/dL (ref 3.4–5.0)
ALKALINE PHOSPHATASE: 73 U/L (ref 46–116)
ALT (SGPT): 32 U/L (ref 10–49)
ANION GAP: 6 mmol/L (ref 5–14)
AST (SGOT): 15 U/L (ref <=34)
BILIRUBIN TOTAL: 0.3 mg/dL (ref 0.3–1.2)
BLOOD UREA NITROGEN: 8 mg/dL — ABNORMAL LOW (ref 9–23)
BUN / CREAT RATIO: 11
CALCIUM: 9.5 mg/dL (ref 8.7–10.4)
CHLORIDE: 108 mmol/L — ABNORMAL HIGH (ref 98–107)
CO2: 27.9 mmol/L (ref 20.0–31.0)
CREATININE: 0.75 mg/dL
EGFR CKD-EPI (2021) FEMALE: >90 mL/min/{1.73_m2} (ref >=60)
GLUCOSE RANDOM: 113 mg/dL (ref 70–179)
POTASSIUM: 4 mmol/L (ref 3.4–4.8)
PROTEIN TOTAL: 6.6 g/dL (ref 5.7–8.2)
SODIUM: 142 mmol/L (ref 135–145)

## 2021-09-30 LAB — TSH: THYROID STIMULATING HORMONE: 1.84 u[IU]/mL (ref 0.550–4.780)

## 2021-09-30 LAB — T4, FREE: FREE T4: 0.93 ng/dL (ref 0.89–1.76)

## 2021-09-30 MED ADMIN — CARBOplatin (PARAPLATIN) 750 mg IVPB: 750 mg | INTRAVENOUS | @ 15:00:00 | Stop: 2021-09-30

## 2021-09-30 MED ADMIN — diphenhydrAMINE (BENADRYL) capsule/tablet 25 mg: 25 mg | ORAL | @ 12:00:00 | Stop: 2021-09-30

## 2021-09-30 MED ADMIN — sodium chloride (NS) 0.9 % infusion: 100 mL/h | INTRAVENOUS | @ 12:00:00 | Stop: 2021-09-30

## 2021-09-30 MED ADMIN — fosaprepitant (EMEND) 150 mg in sodium chloride (NS) 0.9 % 100 mL IVPB: 150 mg | INTRAVENOUS | @ 13:00:00 | Stop: 2021-09-30

## 2021-09-30 MED ADMIN — heparin, porcine (PF) 100 unit/mL injection 500 Units: 500 [IU] | INTRAVENOUS | @ 16:00:00 | Stop: 2021-09-30

## 2021-09-30 MED ADMIN — PACLitaxel (TAXOL) 176.82 mg in sodium chloride NON-PVC (NS) 0.9 % 250 mL IVPB: 80 mg/m2 | INTRAVENOUS | @ 14:00:00 | Stop: 2021-09-30

## 2021-09-30 MED ADMIN — ondansetron (ZOFRAN) tablet 24 mg: 24 mg | ORAL | @ 12:00:00 | Stop: 2021-09-30

## 2021-09-30 MED ADMIN — famotidine (PF) (PEPCID) injection 20 mg: 20 mg | INTRAVENOUS | @ 12:00:00 | Stop: 2021-09-30

## 2021-09-30 MED ADMIN — dexAMETHasone (DECADRON) tablet 12 mg: 12 mg | ORAL | @ 12:00:00 | Stop: 2021-09-30

## 2021-09-30 MED ADMIN — pembrolizumab (KEYTRUDA) 200 mg in sodium chloride (NS) 0.9 % 50 mL IVPB: 200 mg | INTRAVENOUS | @ 13:00:00 | Stop: 2021-09-30

## 2021-09-30 NOTE — Unmapped (Signed)
Infusion on 09/30/2021   Component Date Value Ref Range Status    Sodium 09/30/2021 142  135 - 145 mmol/L Final    Potassium 09/30/2021 4.0  3.4 - 4.8 mmol/L Final    Chloride 09/30/2021 108 (H)  98 - 107 mmol/L Final    CO2 09/30/2021 27.9  20.0 - 31.0 mmol/L Final    Anion Gap 09/30/2021 6  5 - 14 mmol/L Final    BUN 09/30/2021 8 (L)  9 - 23 mg/dL Final    Creatinine 93/23/5573 0.75  0.60 - 0.80 mg/dL Final    BUN/Creatinine Ratio 09/30/2021 11   Final    eGFR CKD-EPI (2021) Female 09/30/2021 >90  >=60 mL/min/1.60m2 Final    eGFR calculated with CKD-EPI 2021 equation in accordance with [employer] and [employer] of Nephrology Task Force recommendations.    Glucose 09/30/2021 113  70 - 179 mg/dL Final    Calcium 22/02/5425 9.5  8.7 - 10.4 mg/dL Final    Albumin 06/25/7626 3.4  3.4 - 5.0 g/dL Final    Total Protein 09/30/2021 6.6  5.7 - 8.2 g/dL Final    Total Bilirubin 09/30/2021 0.3  0.3 - 1.2 mg/dL Final    AST 31/51/7616 15  <=34 U/L Final    ALT 09/30/2021 32  10 - 49 U/L Final    Alkaline Phosphatase 09/30/2021 73  46 - 116 U/L Final    TSH 09/30/2021 1.840  0.550 - 4.780 uIU/mL Final    Free T4 09/30/2021 0.93  0.89 - 1.76 ng/dL Final    WBC 07/37/1062 3.5 (L)  3.6 - 11.2 10*9/L Final    RBC 09/30/2021 3.96  3.95 - 5.13 10*12/L Final    HGB 09/30/2021 9.3 (L)  11.3 - 14.9 g/dL Final    HCT 69/48/5462 29.0 (L)  34.0 - 44.0 % Final    MCV 09/30/2021 73.1 (L)  77.6 - 95.7 fL Final    MCH 09/30/2021 23.4 (L)  25.9 - 32.4 pg Final    MCHC 09/30/2021 32.0  32.0 - 36.0 g/dL Final    RDW 70/35/0093 17.3 (H)  12.2 - 15.2 % Final    MPV 09/30/2021 6.9  6.8 - 10.7 fL Final    Platelet 09/30/2021 199  150 - 450 10*9/L Final    nRBC 09/30/2021 0  <=4 /100 WBCs Final    Neutrophils % 09/30/2021 56.7  % Final    Lymphocytes % 09/30/2021 34.6  % Final    Monocytes % 09/30/2021 7.4  % Final    Eosinophils % 09/30/2021 0.8  % Final    Basophils % 09/30/2021 0.5  % Final    Absolute Neutrophils 09/30/2021 2.0  1.8 - 7.8 10*9/L Final    Absolute Lymphocytes 09/30/2021 1.2  1.1 - 3.6 10*9/L Final    Absolute Monocytes 09/30/2021 0.3  0.3 - 0.8 10*9/L Final    Absolute Eosinophils 09/30/2021 0.0  0.0 - 0.5 10*9/L Final    Absolute Basophils 09/30/2021 0.0  0.0 - 0.1 10*9/L Final    Microcytosis 09/30/2021 Slight (A)  Not Present Final    Anisocytosis 09/30/2021 Slight (A)  Not Present Final

## 2021-09-30 NOTE — Unmapped (Signed)
.      Patient arrived to infusion clinic at 732am accompanied by mother and sister, alert and oriented x 3. Ambulated with steady gait  Current weight obtained and vital signs measured.    Pt reports the following side effects after the last infusion - denies problems or reactions with last infusion  specifically denies rash, diarrhea, sob, ha   Port to leftchest accessed using sterile technique,CHG cleansing of insertion site for greater than 30 seconds.  20g 3/4 in huber needle.  Flushed easily with 10 ml normal saline using pulsatile method, blood return verified.  Clear dressing applied.   Pt denies pain, swelling or irritation with flushing of Port.    Parameters for infusion in treatment plan reviewed and dose calculation using weight from today verified to be within 10% of current dose.  Infusion completed without report of complication or additional side effects.   Post infusion blood return verified,  port flushed with Normal saline and heparin per protocol.  Huber needle removed and site covered with bandaid.  Pt discharged.

## 2021-10-07 ENCOUNTER — Institutional Professional Consult (permissible substitution): Admit: 2021-10-07 | Discharge: 2021-10-07 | Payer: PRIVATE HEALTH INSURANCE

## 2021-10-07 ENCOUNTER — Ambulatory Visit: Admit: 2021-10-07 | Discharge: 2021-10-07 | Payer: PRIVATE HEALTH INSURANCE

## 2021-10-07 DIAGNOSIS — C50011 Malignant neoplasm of nipple and areola, right female breast: Principal | ICD-10-CM

## 2021-10-07 DIAGNOSIS — Z171 Estrogen receptor negative status [ER-]: Principal | ICD-10-CM

## 2021-10-07 DIAGNOSIS — R432 Parageusia: Principal | ICD-10-CM

## 2021-10-07 DIAGNOSIS — C50911 Malignant neoplasm of unspecified site of right female breast: Principal | ICD-10-CM

## 2021-10-07 DIAGNOSIS — Q819 Epidermolysis bullosa, unspecified: Principal | ICD-10-CM

## 2021-10-07 DIAGNOSIS — D509 Iron deficiency anemia, unspecified: Principal | ICD-10-CM

## 2021-10-07 DIAGNOSIS — Z5111 Encounter for antineoplastic chemotherapy: Principal | ICD-10-CM

## 2021-10-07 LAB — COMPREHENSIVE METABOLIC PANEL
ALBUMIN: 3.7 g/dL (ref 3.4–5.0)
ALKALINE PHOSPHATASE: 90 U/L (ref 46–116)
ALT (SGPT): 25 U/L (ref 10–49)
ANION GAP: 7 mmol/L (ref 5–14)
AST (SGOT): 11 U/L (ref <=34)
BILIRUBIN TOTAL: 0.5 mg/dL (ref 0.3–1.2)
BLOOD UREA NITROGEN: 6 mg/dL — ABNORMAL LOW (ref 9–23)
BUN / CREAT RATIO: 9
CALCIUM: 9.4 mg/dL (ref 8.7–10.4)
CHLORIDE: 106 mmol/L (ref 98–107)
CO2: 27.1 mmol/L (ref 20.0–31.0)
CREATININE: 0.68 mg/dL
EGFR CKD-EPI (2021) FEMALE: >90 mL/min/{1.73_m2} (ref >=60)
GLUCOSE RANDOM: 103 mg/dL (ref 70–179)
POTASSIUM: 3.8 mmol/L (ref 3.4–4.8)
PROTEIN TOTAL: 6.7 g/dL (ref 5.7–8.2)
SODIUM: 140 mmol/L (ref 135–145)

## 2021-10-07 LAB — CBC W/ AUTO DIFF
BASOPHILS ABSOLUTE COUNT: 0 10*9/L (ref 0.0–0.1)
BASOPHILS RELATIVE PERCENT: 0.6 %
EOSINOPHILS ABSOLUTE COUNT: 0 10*9/L (ref 0.0–0.5)
EOSINOPHILS RELATIVE PERCENT: 1.2 %
HEMATOCRIT: 28.4 % — ABNORMAL LOW (ref 34.0–44.0)
HEMOGLOBIN: 9.1 g/dL — ABNORMAL LOW (ref 11.3–14.9)
LYMPHOCYTES ABSOLUTE COUNT: 1.3 10*9/L (ref 1.1–3.6)
LYMPHOCYTES RELATIVE PERCENT: 41.1 %
MEAN CORPUSCULAR HEMOGLOBIN CONC: 32.2 g/dL (ref 32.0–36.0)
MEAN CORPUSCULAR HEMOGLOBIN: 23.7 pg — ABNORMAL LOW (ref 25.9–32.4)
MEAN CORPUSCULAR VOLUME: 73.6 fL — ABNORMAL LOW (ref 77.6–95.7)
MEAN PLATELET VOLUME: 7.8 fL (ref 6.8–10.7)
MONOCYTES ABSOLUTE COUNT: 0.5 10*9/L (ref 0.3–0.8)
MONOCYTES RELATIVE PERCENT: 14.9 %
NEUTROPHILS ABSOLUTE COUNT: 1.3 10*9/L — ABNORMAL LOW (ref 1.8–7.8)
NEUTROPHILS RELATIVE PERCENT: 42.2 %
NUCLEATED RED BLOOD CELLS: 0 /100{WBCs} (ref <=4)
PLATELET COUNT: 185 10*9/L (ref 150–450)
RED BLOOD CELL COUNT: 3.86 10*12/L — ABNORMAL LOW (ref 3.95–5.13)
RED CELL DISTRIBUTION WIDTH: 18 % — ABNORMAL HIGH (ref 12.2–15.2)
WBC ADJUSTED: 3.2 10*9/L — ABNORMAL LOW (ref 3.6–11.2)

## 2021-10-07 LAB — IRON PANEL
IRON SATURATION (CALC): 39 %
IRON: 104 ug/dL
TOTAL IRON BINDING CAPACITY (CALC): 269.6 ug/dL (ref 250.0–425.0)
TRANSFERRIN: 214 mg/dL — ABNORMAL LOW

## 2021-10-07 LAB — FERRITIN: FERRITIN: 183 ng/mL

## 2021-10-07 MED ORDER — ONDANSETRON HCL 8 MG TABLET
ORAL_TABLET | Freq: Two times a day (BID) | ORAL | 0 refills | 11 days | Status: CP
Start: 2021-10-07 — End: ?

## 2021-10-07 MED ORDER — SILVER SULFADIAZINE 1 % TOPICAL CREAM
1 refills | 0 days | Status: CP
Start: 2021-10-07 — End: ?

## 2021-10-07 MED ORDER — PROCHLORPERAZINE MALEATE 10 MG TABLET
ORAL_TABLET | Freq: Four times a day (QID) | ORAL | 0 refills | 8 days | Status: CP | PRN
Start: 2021-10-07 — End: ?

## 2021-10-07 MED ADMIN — diphenhydrAMINE (BENADRYL) capsule/tablet 25 mg: 25 mg | ORAL | @ 16:00:00 | Stop: 2021-10-07

## 2021-10-07 MED ADMIN — heparin, porcine (PF) 100 unit/mL injection 500 Units: 500 [IU] | INTRAVENOUS | @ 15:00:00 | Stop: 2021-10-07

## 2021-10-07 MED ADMIN — PACLitaxel (TAXOL) 176.82 mg in sodium chloride NON-PVC (NS) 0.9 % 250 mL IVPB: 80 mg/m2 | INTRAVENOUS | @ 17:00:00 | Stop: 2021-10-07

## 2021-10-07 MED ADMIN — heparin, porcine (PF) 100 unit/mL injection 500 Units: 500 [IU] | INTRAVENOUS | @ 18:00:00 | Stop: 2021-10-07

## 2021-10-07 MED ADMIN — dexAMETHasone (DECADRON) tablet 20 mg: 20 mg | ORAL | @ 16:00:00 | Stop: 2021-10-07

## 2021-10-07 MED ADMIN — famotidine (PF) (PEPCID) injection 20 mg: 20 mg | INTRAVENOUS | @ 16:00:00 | Stop: 2021-10-07

## 2021-10-07 MED ADMIN — sodium chloride (NS) 0.9 % infusion: 100 mL/h | INTRAVENOUS | @ 16:00:00 | Stop: 2021-10-07

## 2021-10-07 NOTE — Unmapped (Addendum)
Clinical Support on 10/07/2021   Component Date Value Ref Range Status    Sodium 10/07/2021 140  135 - 145 mmol/L Final    Potassium 10/07/2021 3.8  3.4 - 4.8 mmol/L Final    Chloride 10/07/2021 106  98 - 107 mmol/L Final    CO2 10/07/2021 27.1  20.0 - 31.0 mmol/L Final    Anion Gap 10/07/2021 7  5 - 14 mmol/L Final    BUN 10/07/2021 6 (L)  9 - 23 mg/dL Final    Creatinine 16/10/9602 0.68  0.60 - 0.80 mg/dL Final    BUN/Creatinine Ratio 10/07/2021 9   Final    eGFR CKD-EPI (2021) Female 10/07/2021 >90  >=60 mL/min/1.84m2 Final    eGFR calculated with CKD-EPI 2021 equation in accordance with [employer] and [employer] of Nephrology Task Force recommendations.    Glucose 10/07/2021 103  70 - 179 mg/dL Final    Calcium 54/09/8117 9.4  8.7 - 10.4 mg/dL Final    Albumin 14/78/2956 3.7  3.4 - 5.0 g/dL Final    Total Protein 10/07/2021 6.7  5.7 - 8.2 g/dL Final    Total Bilirubin 10/07/2021 0.5  0.3 - 1.2 mg/dL Final    AST 21/30/8657 11  <=34 U/L Final    ALT 10/07/2021 25  10 - 49 U/L Final    Alkaline Phosphatase 10/07/2021 90  46 - 116 U/L Final    WBC 10/07/2021 3.2 (L)  3.6 - 11.2 10*9/L Final    RBC 10/07/2021 3.86 (L)  3.95 - 5.13 10*12/L Final    HGB 10/07/2021 9.1 (L)  11.3 - 14.9 g/dL Final    HCT 84/69/6295 28.4 (L)  34.0 - 44.0 % Final    MCV 10/07/2021 73.6 (L)  77.6 - 95.7 fL Final    MCH 10/07/2021 23.7 (L)  25.9 - 32.4 pg Final    MCHC 10/07/2021 32.2  32.0 - 36.0 g/dL Final    RDW 28/41/3244 18.0 (H)  12.2 - 15.2 % Final    MPV 10/07/2021 7.8  6.8 - 10.7 fL Final    Platelet 10/07/2021 185  150 - 450 10*9/L Final    nRBC 10/07/2021 0  <=4 /100 WBCs Final    Neutrophils % 10/07/2021 42.2  % Final    Lymphocytes % 10/07/2021 41.1  % Final    Monocytes % 10/07/2021 14.9  % Final    Eosinophils % 10/07/2021 1.2  % Final    Basophils % 10/07/2021 0.6  % Final    Absolute Neutrophils 10/07/2021 1.3 (L)  1.8 - 7.8 10*9/L Final    Absolute Lymphocytes 10/07/2021 1.3  1.1 - 3.6 10*9/L Final Absolute Monocytes 10/07/2021 0.5  0.3 - 0.8 10*9/L Final    Absolute Eosinophils 10/07/2021 0.0  0.0 - 0.5 10*9/L Final    Absolute Basophils 10/07/2021 0.0  0.0 - 0.1 10*9/L Final    Microcytosis 10/07/2021 Slight (A)  Not Present Final    Anisocytosis 10/07/2021 Slight (A)  Not Present Final    Ferritin 10/07/2021 183.0  7.3 - 270.7 ng/mL Final    Iron 10/07/2021 104  50 - 170 ug/dL Final    TIBC 01/04/7251 269.6  250.0 - 425.0 ug/dL Final    Transferrin 66/44/0347 214.0 (L)  250.0 - 380.0 mg/dL Final    Iron Saturation (%) 10/07/2021 39  % Final

## 2021-10-07 NOTE — Unmapped (Signed)
Medical Oncology Return Patient Note    Patient Name: Tara Lee  Patient Age: 36 y.o.  Encounter Date: 10/07/2021    PCP: Tara Dutton, MD    Cancer Team  Surgical Oncology: Tara Cooter, DO  Surgical Oncology NP: Pollyann Samples, NP and Tara Primes, NP  Radiation Oncology: Tara Chimes, MD and Tara Penta, NP  Medical Oncology: Tara Morgans, MD  Plastic Surgery: None at this time    Genetics: [facility] Breast Cancer STAT panel with BRCA1/2, CDH1, PALB2, PTEN and TP53, on 04/01/2015, was negative.    Reason for visit:  Follow up for management of breast cancer.    Assessment/Plan    1. 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 Her staging CT and Bone Scan do not show evidence of metastatic disease.  There is a question of uptake in the sternum. This area was evaluated on breast MRI which shows that the mass extends within the medial dermis and posteriorly to the anterior fibers of the pectoralis muscle. She declined AC due to concerns about secondary hematologic malignancy. Port catheter placed 7/5.   --Initiated weekly Taxol + q3w carboplatin + pembrolizumab on 07/22/21 appointment, completing 10/14/21. Plan to complete 1 year pembro and will resume after surgery.  --DR taxol to 80% 08/19/21 due to leukopenia and neutropenia. C2D15 taxol held and not made up, again for neutropenia. C3 D1 held due to ANC 800. Resumed weekly therapy 09/09/21 with addition of GCSF and this has been well tolerated.  --Continue daily GCSF during Carbo weeks only x 3 days starting 24h after chemo.  Advised Claritin 10mg  daily for 5-7 days during this time to help prevent bone pain.  Sister gives injections at home.  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  Would like to re involve Dermatology for support as she enters breast cancer treatment again.   - Ambulatory referral to Dermatology; Future  - 8/4 Rx silvadene cream to use PRN    Genetics: [facility] panel from March 2017 negative. Discussed updated genetic testing w/counselor on 6/29 but elected to defer testing at this time to focus on current treatment     Bone Health  DEXA 06/28/21: Asymmetric uptake in the sternum is of uncertain significance and has no CT correlate; recommend attention on follow-up. No other evidence of osseous metastatic disease.    Supportive care  - Referral to [facility] for additional support  - Declines Oncofertility referral, discussed ovarian suppression during chemo, she will consider.  - Is not interested in Dignicap, but will plan chemo weekly in [place].  - Taste alteration and decreased appetite after chemo. Discussed how to add protein into smoothies with whey protein or nut butters.  Doing well maintaining hydration.      Communication  - Patient prefers to receive clinical information by phone in between appointments versus myChart messages.    Anemia  --check ferritin and iron panel today  --may continue oral iron  - advise MWF schedule    Dispo:  --Proceed today with  C4 D8 neoadjuvant chemotherapy today . Has had a good clinical response.  --RTC 1 week for Taxol alone -- last chemotherapy visit  --Repeat bilateral breast MRI, R mammo and ultrasound are scheduled +  Tara Lee visit the following week   --have estimated return to resume Pembrolizumab ~12/1 and have requested appts for that day and entered orders.    -------------------------------------------------------------------------------  Interval hx/ROS:  Tara Lee presents today with her mother & her sister for ongoing management of her breast cancer.   --MMG/US this morning shows improvement  --Feeling well. No PN symptoms.  No interval illness or fevers. Since addition of GCSF she has missed no chemo doses.  -- stable loss off appetite after Carbo.   --takes zofran BID scheduled x 3 days on Carbo weeks. Very rare compazine. Never vomits.  --Skin is doing well other than hyperpigmentation is lasting longer around areas of injury related to the Epidermolysis bullosa  --Reports breast mass is palpably smaller  --Full 12 ROS reviewed and otherwise negative/none      History of Present Illness   Tara Lee is a 37 y.o. woman who presents today accompanied by her mother for medical oncology evaluation of a\n in-breast tumor recurrence of her previously diagnosed right breast cancer.     Previously she was dx 03/2015 with a cT2N1 triple negative breast cancer at age 66.  She received neoadjuvant Carbo/Taxol, but declined AC chemotherapy.  Denies any chemotherapy induced peripheral neuropathy symptoms from her prior chemotherapy.   --She underwent a R breast lumpectomy and ALND with residual 8mm tumor and 1/27 axillary lymph nodes involved with tumor, RCB II.  She completed right breast RT in 11/2015.  --Had negative genetic testing at this time.  --She has followed routinely with Tara Penta, NP in Radiation Oncology for surveillance.  She was a former medical oncology patient of Tara Lee.     06/06/21 Mammogram revealed a 2.4cm right breast mass at the prior lumpectomy side.    --Biopsy of the 3oc mass 3cm FN revealed gr3 IDCA, triple negative.  --She saw Tara Lee who rec'd consideration of neoadjuvant chemotherapy.   --Staging CT, BS and breast MRI were completed. Question of uptake in sternum on bone scan. MRI showed that the mass extends within the medial dermis and posteriorly to the anterior fibers of the pectoralis muscle.    Her PMH is remarkable for Epidermolysis bullosa, diagnosed as a neonate by [facility]. Previously saw Dr. Inis Lee at [facility] during her prior chemotherapy treatment, but tolerated chemotherapy, port placement, breast surgery and XRT very well without major skin toxicities.     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 She lives with her mother, since her chemo in 2017. Is not interested in childbearing.     Breast Cancer History  Oncology History Overview Note   2023: Recurrent Right breast cT2 cN0 IDC, G3 -/-/-  2017: Right breast ypT1b ypN44mic M0 IDC, G3, -/-/-       Malignant neoplasm of right breast in female, estrogen receptor negative (CMS-HCC)   02/23/2015 -  Presenting Symptoms    Presented to [facility] for evaluation of self detected right breast mass. Noticed bleeding, nipple discharge and blister after scratching an itchy area. Has a history of Epidermolysis bullosa.      03/03/2015 Initial Diagnosis    Malignant neoplasm of upper-inner quadrant of female breast [facility]): Clinical stage: At least IIB [cT2, cN1]     03/03/2015 Interval Scan(s)    MMG/US in [place]: 4.4 cm irregular mass in the right breast at 2:30 position with enlarged right axillary LN measuring 3.7 cm.  03/03/2015 Biopsy    Rt core 2:30: Gr 3, LVI+, IDC. ER (0), PR (0), HER-2 FISH neg (# 2.05, ratio 1.37) Ki-67 90%. No DCIS. Rt axillary LN: Positive for carcinoma, similar to carcinoma in breast core. No LN tissue. ER (0), PR (0), HER-2 FISH neg (#2.3, ratio 1.18) Ki-67 70%.     03/11/2015 Interval Scan(s)    MRI breast: R LIQ 4.7 x 4 x 4cm bx proven cancer, add'l 1.3cm spic mass 1.5cm ant to cancer, add'l 7mm mass superior (prob fibroadenoma), R ax lymphadenopathy, retropectoral LN, 1.4cm susp internal mam LN. L breast clear. Rec bx NME & mass if BCT possible     03/17/2015 -  Other    MDC rec: cT2N1 TN IDC. Neoadj chemo. Needs staging. Discussed epidermolysis bullosa, will get in with [facility] derm. Skin delicate and would likely not tolerate recon if mastectomy needed. Meeting genetics. Fertility discussion. Rec adj XRT. MRI int review.     03/24/2015 Interval Scan(s)    CT CAP: Heterogeneous right breast mass and right axillary adenopathy. Two subcentimeter hypodensities in the liver are indeterminate. Attention on followup. Otherwise, no evidence of metastatic disease in the abdomen or pelvis.     03/24/2015 Interval Scan(s)    Bone scan: No osseous mets.     03/24/2015 Genetics    Negative for BRCA1, BRCA2, CDH1, PALB2, PTEN, and TP53.     04/14/2015 Biopsy    MRI biopsy R breast NME: benign breast tissue, no atypia or carcinoma. Additional R breast mass biopsy: benign breast tissue with dense stromal fibrosis, no atypia or carcinoma.     04/15/2015 - 07/18/2015 Chemotherapy    Chemotherapy Treatment    Treatment Goal Curative   Line of Treatment [No plan line of treatment]   Plan Name OP Breast Carboplatin / weekly PACLitaxel   Start Date 04/15/2015   End Date 07/08/2015   Provider Olivia Mackie, MD   Chemotherapy dexamethasone (DECADRON) tablet 12 mg, 12 mg, Oral, Once, 4 of 4 cycles    CARBOplatin (PARAPLATIN) 900 mg IVPB, 900 mg (100 % of original dose 900 mg), Intravenous, Once, 4 of 4 cycles  Dose modification:   (original dose 900 mg, Cycle 1),   (original dose 900 mg, Cycle 2),   (original dose 900 mg, Cycle 3),   (original dose 900 mg, Cycle 4),   (original dose 900 mg, Cycle 4),   (original dose 900 mg, Cycle 4),   (original dose 900 mg, Cycle 4),   (original dose 900 mg, Cycle 4),   (original dose 900 mg, Cycle 4),   (original dose 900 mg, Cycle 4)    PACLItaxel (TAXOL) 166.38 mg in sodium chloride 0.9% NON-PVC (NS) 250 mL IVPB, 80 mg/m2 = 166.38 mg, Intravenous, Once, 4 of 4 cycles          08/10/2015 Surgery    Right partial mastectomy w ALND w ARM: residual 8mm of IDC, Gr 2, ER/PR/H2N negative, negative margins.  1/27 lymph nodes with metastatic disease, largest focus measuring 0.29mm, + LVI, No ECE.  RCB- II (2.174)        - 11/18/2015 Radiation     R breast and lymph nodes: 200 cGy x 25 = 5,000 cGy(Goal for IMN 95% at 45 Gy)  Boost to tumor bed: 200 cGy x 5 = 1000 cGY  Boost to IMN: 200 cGy x 8 = 1600 cGy with electrons     06/15/2021 Interval Scan(s)    MMG/US: Right breast, 3:00 3  CFN, there is a 2.4 x 1.9 x 2.0 cm solid irregular mass.     06/16/2021 Recurrence    Right breast USG core biopsy: IDC, G3, ER-, PR-, Her2-(1+).     06/27/2021 -  Cancer Staged    Staging form: Breast, AJCC 8th Edition  - Clinical stage from 06/27/2021: Stage IIB (rcT2, cN0, cM0, G3, ER-, PR-, HER2-) - Signed by Rudie Meyer, ANP on 06/27/2021       06/29/2021 Tumor Board    MDC recs: History of right breast TNBC (ypT1b ypN14mi) s/p partial mastectomy and ALND. Now with recurrent right TNBC.  TC previously. On exam, this is tethered to the chest wall at the inframammary fold. Imaging review with it abutting, but does not look to be involving. Discussed consideration for NACT again. 2.7 cm on imaging, 4.5 cm on exam.   Genetics: prior limited GT (BRCA1, BRCA2, CDH1, PALB2, PTEN, TP53) that was negative in 2017. Recommend referral to genetics for updated assessment/discussion for additional GT.     07/22/2021 -  Chemotherapy    OP breast paclitaxel weekly/ carboplatin q3 weeks/ pembro q3 weeks  PACLitaxel 80 mg/m2 IV on Days 1, 8, and 15 / CARBOplatin AUC 1.5 IV on Days 1, 8, and 15 / Pembrolizumab 200 mg IV on Day 1  21-day cycle x 4 cycles    Followed by:  DOXOrubicin 60 mg/m2 IV on Day 1 / Cyclophosphamide 600 mg/m2 IV on Day 1 / Pembrolizumab 200 mg IV on Day 1  21-day cycle x 4 cycles           Past Medical History  Past Medical History:   Diagnosis Date    Breast cancer (CMS-HCC)     Right 2017    Epidermolysis bullosa        Reproductive History  Menarche: 11   G0 P0  LMP: 06/06/21  Current Contraception:   OCP use: no      Surgical History  Past Surgical History:   Procedure Laterality Date    BREAST BIOPSY Right     malignant    BREAST BIOPSY Right 06/16/2021    recurrent IDC    BREAST LUMPECTOMY Right 08/10/2015    CHEMOTHERAPY Right 03/2015    IR INSERT PORT AGE GREATER THAN 5 YRS  07/06/2021 IR INSERT PORT AGE GREATER THAN 5 YRS 07/06/2021 Dorene Ar, PA IMG VIR [facility]    PR MASTECTOMY, PARTIAL Right 08/10/2015    Procedure: MASTECTOMY, PARTIAL (EG, LUMPECTOMY, TYLECTOMY, QUADRANTECTOMY, SEGMENTECTOMY);  Surgeon: Talbert Cage, DO;  Location: ASC OR [facility];  Service: Surgical Oncology    PR REMOVE ARMPITS LYMPH NODES COMPLT Right 08/10/2015    Procedure: AXILLARY LYMPHADENECTOMY; COMPLETE;  Surgeon: Talbert Cage, DO;  Location: ASC OR [facility];  Service: Surgical Oncology    RADIATION         Medications  Current Outpatient Medications   Medication Sig Dispense Refill    chlorhexidine (HIBICLENS) 4 % external liquid Apply topically daily as needed. 473 mL 12    ferrous sulfate 325 (65 FE) MG tablet Take 1 tablet (325 mg total) by mouth in the morning. 30 tablet 5    ondansetron (ZOFRAN) 8 MG tablet Take 1 tablet (8 mg total) by mouth two (2) times a day. Take 8 mg twice a day on days 2, 3, and 4. AND then, 8 mg every 8 hours as needed. 30 tablet 5    prochlorperazine (COMPAZINE) 10 MG tablet Take 1 tablet (10 mg  total) by mouth every six (6) hours as needed (Nausea/Vomiting). 30 tablet 2    filgrastim-aafi (NIVESTYM) 480 mcg/0.8 mL Syrg injection syringe Inject the contents of 1 syringe (480 mcg total) under the skin daily for 3 days starting 24 hours after carboplatin chemo. Cycle is every 21 days. (Patient not taking: Reported on 10/07/2021) 2.4 mL 1    silver sulfaDIAZINE (SSD) 1 % cream APPLY TOPICALLY TO THE AFFECTED AREA 1 TO 2 TIMES DAILY 50 g 1     No current facility-administered medications for this visit.       Allergies  No Known Allergies    Personal and Social History  Social History     Social History Narrative    Lives in [place], [place]. She currently works at [employer]. Smoked half pack per day of cigarettes/cigars for 5 years when she was in college. Quit smoking a few years ago. She enjoys Bible dancing. She has strong spiritual beliefs and faith.       Family History  Mat grand with ovarian CA    Review of Systems: A complete review of systems was obtained including: Constitutional, Eyes, ENT, Cardiovascular, Respiratory, GI, GU, Musculoskeletal, Skin, Neurological, Psychiatric, Endocrine, Heme/Lymphatic, and Allergic/Immunologic systems. It is negative or non-contributory to the patient???s management except for the following: see interval history for details      Physical Exam  GENERAL: Well appearing female in no acute distress.  VITAL SIGNS: BP 133/88  - Pulse 87  - Temp 36.8 ??C (98.3 ??F) (Temporal)  - Resp 16  - Wt (!) 102.9 kg (226 lb 12.8 oz)  - SpO2 100%  - BMI 37.74 kg/m??   HEENT: Normocephalic, symmetric. EOMI. Sclera clear.  LYMPH: no cervical, supraclavicular or axillary lymphadenopathy.  CHEST/BREASTS: There is a much smaller/flatter less firm mass in the medial right breast at 3 o'clock 7 CFN at sternal border, measuring 1.0 x 2.0cm. Remainder of the bilateral breasts without mass, concerning skin change, retraction or nipple discharge.  No lymphedema noted.  LUNGS: respirations even and unlabored, clear to ausculation bilaterally.  CARDIAC: RRR, no murmurs.   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  Small wound lateral right breast -No evidence of infection  EXTREMITIES: No upper or lower extremity edema, normal skin tone.  MSK: Spine is nontender.  NEURO: Alert, oriented, normal gait & coordination.    Orders/Results  Reviewed in EMR

## 2021-10-07 NOTE — Unmapped (Signed)
Patient arrived in the infusion clinic at 1215.  Weight and vitals were obtained. Port was accessed during clinic nurse visit. RN flushed with NS, brisk blood return noted. Dressing clean, dry, and intact.  Labs were drawn  during clinic nurse visit and resulted within treatment plan parameters. Patient was administered premedications at 1229.  Patient was administered paclitaxel as ordered without complications while in the clinic. Port with brisk blood return following infusion. Flushed with NS and heparin per protocol and deaccessed. Gauze and bandaid placed over site. Patient given after visit summary then discharged home to self care.

## 2021-10-14 ENCOUNTER — Ambulatory Visit: Admit: 2021-10-14 | Discharge: 2021-10-15 | Payer: PRIVATE HEALTH INSURANCE

## 2021-10-14 DIAGNOSIS — Z171 Estrogen receptor negative status [ER-]: Principal | ICD-10-CM

## 2021-10-14 DIAGNOSIS — C50911 Malignant neoplasm of unspecified site of right female breast: Principal | ICD-10-CM

## 2021-10-14 DIAGNOSIS — C50011 Malignant neoplasm of nipple and areola, right female breast: Principal | ICD-10-CM

## 2021-10-14 LAB — COMPREHENSIVE METABOLIC PANEL
ALBUMIN: 3.1 g/dL — ABNORMAL LOW (ref 3.4–5.0)
ALKALINE PHOSPHATASE: 69 U/L (ref 46–116)
ALT (SGPT): 19 U/L (ref 10–49)
ANION GAP: 7 mmol/L (ref 5–14)
AST (SGOT): 12 U/L (ref <=34)
BILIRUBIN TOTAL: 0.3 mg/dL (ref 0.3–1.2)
BLOOD UREA NITROGEN: 6 mg/dL — ABNORMAL LOW (ref 9–23)
BUN / CREAT RATIO: 10
CALCIUM: 8.4 mg/dL — ABNORMAL LOW (ref 8.7–10.4)
CHLORIDE: 108 mmol/L — ABNORMAL HIGH (ref 98–107)
CO2: 25.6 mmol/L (ref 20.0–31.0)
CREATININE: 0.63 mg/dL
EGFR CKD-EPI (2021) FEMALE: >90 mL/min/{1.73_m2} (ref >=60)
GLUCOSE RANDOM: 99 mg/dL (ref 70–179)
POTASSIUM: 3.6 mmol/L (ref 3.4–4.8)
PROTEIN TOTAL: 6.1 g/dL (ref 5.7–8.2)
SODIUM: 141 mmol/L (ref 135–145)

## 2021-10-14 LAB — CBC W/ AUTO DIFF
BASOPHILS ABSOLUTE COUNT: 0 10*9/L (ref 0.0–0.1)
BASOPHILS RELATIVE PERCENT: 0.8 %
EOSINOPHILS ABSOLUTE COUNT: 0 10*9/L (ref 0.0–0.5)
EOSINOPHILS RELATIVE PERCENT: 1.2 %
HEMATOCRIT: 23.9 % — ABNORMAL LOW (ref 34.0–44.0)
HEMOGLOBIN: 7.7 g/dL — ABNORMAL LOW (ref 11.3–14.9)
LYMPHOCYTES ABSOLUTE COUNT: 1.4 10*9/L (ref 1.1–3.6)
LYMPHOCYTES RELATIVE PERCENT: 41.2 %
MEAN CORPUSCULAR HEMOGLOBIN CONC: 32.4 g/dL (ref 32.0–36.0)
MEAN CORPUSCULAR HEMOGLOBIN: 24.3 pg — ABNORMAL LOW (ref 25.9–32.4)
MEAN CORPUSCULAR VOLUME: 74.9 fL — ABNORMAL LOW (ref 77.6–95.7)
MEAN PLATELET VOLUME: 7.2 fL (ref 6.8–10.7)
MONOCYTES ABSOLUTE COUNT: 0.2 10*9/L — ABNORMAL LOW (ref 0.3–0.8)
MONOCYTES RELATIVE PERCENT: 7.3 %
NEUTROPHILS ABSOLUTE COUNT: 1.7 10*9/L — ABNORMAL LOW (ref 1.8–7.8)
NEUTROPHILS RELATIVE PERCENT: 49.5 %
NUCLEATED RED BLOOD CELLS: 0 /100{WBCs} (ref <=4)
PLATELET COUNT: 117 10*9/L — ABNORMAL LOW (ref 150–450)
RED BLOOD CELL COUNT: 3.19 10*12/L — ABNORMAL LOW (ref 3.95–5.13)
RED CELL DISTRIBUTION WIDTH: 18 % — ABNORMAL HIGH (ref 12.2–15.2)
WBC ADJUSTED: 3.3 10*9/L — ABNORMAL LOW (ref 3.6–11.2)

## 2021-10-14 MED ADMIN — PACLitaxel (TAXOL) 176.82 mg in sodium chloride NON-PVC (NS) 0.9 % 250 mL IVPB: 80 mg/m2 | INTRAVENOUS | @ 18:00:00 | Stop: 2021-10-14

## 2021-10-14 MED ADMIN — dexAMETHasone (DECADRON) tablet 20 mg: 20 mg | ORAL | @ 17:00:00 | Stop: 2021-10-14

## 2021-10-14 MED ADMIN — gadobenate dimeglumine (MULTIHANCE) 529 mg/mL (0.1mmol/0.2mL) solution 15 mL: 15 mL | INTRAVENOUS | @ 13:00:00 | Stop: 2021-10-14

## 2021-10-14 MED ADMIN — sodium chloride (NS) 0.9 % infusion: 100 mL/h | INTRAVENOUS | @ 18:00:00 | Stop: 2021-10-14

## 2021-10-14 MED ADMIN — famotidine (PF) (PEPCID) injection 20 mg: 20 mg | INTRAVENOUS | @ 17:00:00 | Stop: 2021-10-14

## 2021-10-14 MED ADMIN — heparin, porcine (PF) 100 unit/mL injection 500 Units: 500 [IU] | INTRAVENOUS | @ 19:00:00 | Stop: 2021-10-14

## 2021-10-14 MED ADMIN — diphenhydrAMINE (BENADRYL) capsule/tablet 25 mg: 25 mg | ORAL | @ 17:00:00 | Stop: 2021-10-14

## 2021-10-14 NOTE — Unmapped (Signed)
Patient arrived in the infusion clinic at 1200.  Weight and Vitals were obtained. Port was accessed RN  flushed with blood return, dressing clean, dry, and intact.  Labs were drawn and resulted within treatment plan parameters.  Patient was administered premedications at 1308.  Patient was administered chemotherapy regiment as ordered without complications while in the clinic. Port deaccessed and heparin flushed and post treatment blood return present.  Patient declined after visit summary then discharged home to self care.

## 2021-10-17 ENCOUNTER — Ambulatory Visit: Admit: 2021-10-17 | Discharge: 2021-10-18 | Payer: PRIVATE HEALTH INSURANCE

## 2021-10-17 DIAGNOSIS — C50911 Malignant neoplasm of unspecified site of right female breast: Principal | ICD-10-CM

## 2021-10-17 DIAGNOSIS — Z171 Estrogen receptor negative status [ER-]: Principal | ICD-10-CM

## 2021-10-17 NOTE — Unmapped (Unsigned)
Patient Name: Morna Isabelle Kicklighter  Patient Age: 36 y.o.  Encounter Date: 10/17/2021    Referring Physician:   Griselda Miner, FNP  [address]  [place],  [place] [address]    Primary Care Provider:  Mont Dutton, MD    Breast Cancer Treatment Team:  Surgical Oncology: Sherilyn Cooter, DO  Surgical Oncology NP: Pollyann Samples, NP and Sonda Primes, NP  Radiation Oncology: Thom Chimes, MD  Medical Oncology: Marc Morgans, MD  Plastic Surgery: None at this time  Genetics: [facility] Breast Cancer STAT panel with BRCA1/2, CDH1, PALB2, PTEN and TP53, on 04/01/2015, was negative. elected to defer testing at this time to focus on current treatment.      Reason for Visit: Discuss surgical options post neoadjuvant chemotherapy for recurrent right breast cancer    HPI:  IREANNA TAIWO is a 36 y.o. female who returns to discuss surgical planning for a known recurrent right breast cancer. She reported at her initial visit that this was discovered as the result of an abnormal screening mammogram performed on 06/06/2021 at [facility]. Cheresa underwent diagnostic imaging, which revealed a 2.4 x 1.9 x 2.0 cm solid irregular mass in the right breast at the 3 o'clock 3 CFN position. Subsequent biopsy of lesion revealed recurrent IDC, G3, -/-/-. She states that she had noticed this area of concern, but had related it to scar tissue from prior surgery. She is accompanied today by her multiple family members, who also contributes to the history. She denies associated symptoms including pain, redness, fever, chills, breast mass, breast retraction, nipple inversion, breast swelling, nipple discharge, nausea, vomiting or weight loss.      At our initial visit, I determined that Sarang would benefit from neoadjuvant chemotherapy. She underwent 4/4 cycles of Taxol + q3w carboplatin + pembrolizumab under the direction of Dr. Avis Epley. Last dose was on 10/14/2021. She reports a moderate decrease in tumor size.    Today, Dashaya is doing well overall. She reports experiencing no appetite during her chemotherapy. Additionally, she experienced hand discoloration but denies any tingling. We discussed the option for breast conservation versus mastectomy.  Given her recurrence, she is recommended to undergo mastectomy which she is agreeable to. We discussed the role of radiation in both settings. We discussed staging the axilla and that she is a candidate for omitting further surgery given she underwent sentinel lymph node biopsy in her previous cancer diagnosis in 2017. We discussed the option for reconstruction with mastectomy. Amberdawn is interested in possible reconstruction in a delayed fashion. We reviewed that she will need to wait 6 months post operatively before she is able to undergo further surgery. Lastly, she reports a boil on her breast that has started draining. She is currently using a silvadene cream. I will prescribe silvadene hibiclens which she will apply to area of drainage. I will prescribe an antibiotic and she may start this if she starts to experience redness or fevers.    Historically,??Yoselyn ??presented with bloody??discharge from the right breast in February. ??She then palpated a right breast mass. ??She saw her local healthcare provider and she underwent imaging. ??A mass was seen in the right breast and also a concerning right axillary lymph node. ??Biopsies were taken of the right breast mass and right axillary lymph node. She was found to have Stage IIB (T2, N1, M0). She had neoadjuvant Taxol, Carbo chemotherapy and declined adjuvant AC. She underwent a Right??wire??localized??partial mastectomy, axillary lymph node dissection and axillary reverse mapping, performed on  08/10/2015 by Dr. Dellis Anes. Kimber completed adjuvant radiation on 11/18/2015.    Hereditary Cancer Family History Assessment:  Total number of children: 0  History of cancer in children: no  Total number of siblings (including deceased): N/A  History of cancer in siblings: no  History of cancer in parents: no  History of cancer in Maternal aunts/uncles/grandparents: yes grandmother diagnosed with ovarian cancer at age unknown and grandfather diagnosed with leukemia at age 4's  History of cancer in Paternal aunts/uncles/grandparents: no      Clinical Trial Participant:   1. No clinical trials at this time.    Medical history reviewed as below. I have also reviewed additional records as provided.  Oncology History Overview Note   2023: Recurrent Right breast cT2 cN0 IDC, G3 -/-/-  2017: Right breast ypT1b ypN49mic M0 IDC, G3, -/-/-       Malignant neoplasm of right breast in female, estrogen receptor negative (CMS-HCC)   02/23/2015 -  Presenting Symptoms    Presented to [facility] for evaluation of self detected right breast mass. Noticed bleeding, nipple discharge and blister after scratching an itchy area. Has a history of Epidermolysis bullosa.      03/03/2015 Initial Diagnosis    Malignant neoplasm of upper-inner quadrant of female breast [facility]): Clinical stage: At least IIB [cT2, cN1]     03/03/2015 Interval Scan(s)    MMG/US in [place]: 4.4 cm irregular mass in the right breast at 2:30 position with enlarged right axillary LN measuring 3.7 cm.     03/03/2015 Biopsy    Rt core 2:30: Gr 3, LVI+, IDC. ER (0), PR (0), HER-2 FISH neg (# 2.05, ratio 1.37) Ki-67 90%. No DCIS. Rt axillary LN: Positive for carcinoma, similar to carcinoma in breast core. No LN tissue. ER (0), PR (0), HER-2 FISH neg (#2.3, ratio 1.18) Ki-67 70%.     03/11/2015 Interval Scan(s)    MRI breast: R LIQ 4.7 x 4 x 4cm bx proven cancer, add'l 1.3cm spic mass 1.5cm ant to cancer, add'l 7mm mass superior (prob fibroadenoma), R ax lymphadenopathy, retropectoral LN, 1.4cm susp internal mam LN. L breast clear. Rec bx NME & mass if BCT possible     03/17/2015 -  Other    MDC rec: cT2N1 TN IDC. Neoadj chemo. Needs staging. Discussed epidermolysis bullosa, will get in with [facility] derm. Skin delicate and would likely not tolerate recon if mastectomy needed. Meeting genetics. Fertility discussion. Rec adj XRT. MRI int review.     03/24/2015 Interval Scan(s)    CT CAP: Heterogeneous right breast mass and right axillary adenopathy. Two subcentimeter hypodensities in the liver are indeterminate. Attention on followup. Otherwise, no evidence of metastatic disease in the abdomen or pelvis.     03/24/2015 Interval Scan(s)    Bone scan: No osseous mets.     03/24/2015 Genetics    Negative for BRCA1, BRCA2, CDH1, PALB2, PTEN, and TP53.     04/14/2015 Biopsy    MRI biopsy R breast NME: benign breast tissue, no atypia or carcinoma. Additional R breast mass biopsy: benign breast tissue with dense stromal fibrosis, no atypia or carcinoma.     04/15/2015 - 07/18/2015 Chemotherapy    Chemotherapy Treatment    Treatment Goal Curative   Line of Treatment [No plan line of treatment]   Plan Name OP Breast Carboplatin / weekly PACLitaxel   Start Date 04/15/2015   End Date 07/08/2015   Provider Olivia Mackie, MD   Chemotherapy dexamethasone (DECADRON)  tablet 12 mg, 12 mg, Oral, Once, 4 of 4 cycles    CARBOplatin (PARAPLATIN) 900 mg IVPB, 900 mg (100 % of original dose 900 mg), Intravenous, Once, 4 of 4 cycles  Dose modification:   (original dose 900 mg, Cycle 1),   (original dose 900 mg, Cycle 2),   (original dose 900 mg, Cycle 3),   (original dose 900 mg, Cycle 4),   (original dose 900 mg, Cycle 4),   (original dose 900 mg, Cycle 4),   (original dose 900 mg, Cycle 4),   (original dose 900 mg, Cycle 4),   (original dose 900 mg, Cycle 4),   (original dose 900 mg, Cycle 4)    PACLItaxel (TAXOL) 166.38 mg in sodium chloride 0.9% NON-PVC (NS) 250 mL IVPB, 80 mg/m2 = 166.38 mg, Intravenous, Once, 4 of 4 cycles          08/10/2015 Surgery    Right partial mastectomy w ALND w ARM: residual 8mm of IDC, Gr 2, ER/PR/H2N negative, negative margins.  1/27 lymph nodes with metastatic disease, largest focus measuring 0.28mm, + LVI, No ECE.  RCB- II (2.174)        - 11/18/2015 Radiation     R breast and lymph nodes: 200 cGy x 25 = 5,000 cGy(Goal for IMN 95% at 45 Gy)  Boost to tumor bed: 200 cGy x 5 = 1000 cGY  Boost to IMN: 200 cGy x 8 = 1600 cGy with electrons     06/15/2021 Interval Scan(s)    MMG/US: Right breast, 3:00 3 CFN, there is a 2.4 x 1.9 x 2.0 cm solid irregular mass.     06/16/2021 Recurrence    Right breast USG core biopsy: IDC, G3, ER-, PR-, Her2-(1+).     06/27/2021 -  Cancer Staged    Staging form: Breast, AJCC 8th Edition  - Clinical stage from 06/27/2021: Stage IIB (rcT2, cN0, cM0, G3, ER-, PR-, HER2-) - Signed by Rudie Meyer, ANP on 06/27/2021       06/29/2021 Tumor Board    MDC recs: History of right breast TNBC (ypT1b ypN43mi) s/p partial mastectomy and ALND. Now with recurrent right TNBC.  1. TC previously. On exam, this is tethered to the chest wall at the inframammary fold. Imaging review with it abutting, but does not look to be involving. Discussed consideration for NACT again. 2.7 cm on imaging, 4.5 cm on exam.   2. Genetics: prior limited GT (BRCA1, BRCA2, CDH1, PALB2, PTEN, TP53) that was negative in 2017. Recommend referral to genetics for updated assessment/discussion for additional GT.     07/22/2021 -  Chemotherapy    OP breast paclitaxel weekly/ carboplatin q3 weeks/ pembro q3 weeks  PACLitaxel 80 mg/m2 IV on Days 1, 8, and 15 / CARBOplatin AUC 1.5 IV on Days 1, 8, and 15 / Pembrolizumab 200 mg IV on Day 1  21-day cycle x 4 cycles    Followed by:  DOXOrubicin 60 mg/m2 IV on Day 1 / Cyclophosphamide 600 mg/m2 IV on Day 1 / Pembrolizumab 200 mg IV on Day 1  21-day cycle x 4 cycles     12/02/2021 -  Chemotherapy    OP PEMBROLIZUMAB 400 MG Q6W  Pembrolizumab 400 mg IV on Day 1  42-day cycle       Past Medical History:   Diagnosis Date   ??? Breast cancer (CMS-HCC)     Right 2017   ??? Epidermolysis bullosa      Past Surgical History:  Procedure Laterality Date   ??? BREAST BIOPSY Right     malignant   ??? BREAST BIOPSY Right 06/16/2021    recurrent IDC   ??? BREAST LUMPECTOMY Right 08/10/2015   ??? CHEMOTHERAPY Right 03/2015   ??? IR INSERT PORT AGE GREATER THAN 5 YRS  07/06/2021    IR INSERT PORT AGE GREATER THAN 5 YRS 07/06/2021 Dorene Ar, PA IMG VIR [facility]   ??? PR MASTECTOMY, PARTIAL Right 08/10/2015    Procedure: MASTECTOMY, PARTIAL (EG, LUMPECTOMY, TYLECTOMY, QUADRANTECTOMY, SEGMENTECTOMY);  Surgeon: Talbert Cage, DO;  [facility];  Service: Surgical Oncology   ??? PR REMOVE ARMPITS LYMPH NODES COMPLT Right 08/10/2015    Procedure: AXILLARY LYMPHADENECTOMY; COMPLETE;  Surgeon: Talbert Cage, DO;  [facility];  Service: Surgical Oncology   ??? RADIATION       Family History   Problem Relation Age of Onset   ??? No Known Problems Mother    ??? Heart disease Father    ??? No Known Problems Sister    ??? Ovarian cancer Maternal Grandmother 68   ??? Leukemia Maternal Grandfather         dx 58s   ??? No Known Problems Paternal Grandmother    ??? No Known Problems Paternal Grandfather    ??? No Known Problems Daughter    ??? No Known Problems Other    ??? Breast cancer Neg Hx    ??? Colon cancer Neg Hx    ??? Endometrial cancer Neg Hx    ??? Melanoma Neg Hx    ??? Basal cell carcinoma Neg Hx    ??? Squamous cell carcinoma Neg Hx    ??? BRCA 1/2 Neg Hx    ??? Cancer Neg Hx      Social History     Socioeconomic History   ??? Marital status: Single   Tobacco Use   ??? Smoking status: Former     Packs/day: 0.50     Years: 5.00     Additional pack years: 0.00     Total pack years: 2.50     Types: Cigarettes     Quit date: 2008     Years since quitting: 15.8   ??? Smokeless tobacco: Never   Vaping Use   ??? Vaping Use: Never used   Substance and Sexual Activity   ??? Alcohol use: No     Alcohol/week: 0.0 standard drinks of alcohol   ??? Drug use: Never   ??? Sexual activity: Not Currently   Other Topics Concern   ??? Do you use sunscreen? No   ??? Tanning bed use? No   ??? Are you easily burned? No   ??? Excessive sun exposure? No   ??? Blistering sunburns? No Social History Narrative    Lives in [place], [place]. She currently works at [employer]. Smoked half pack per day of cigarettes/cigars for 5 years when she was in college. Quit smoking a few years ago. She enjoys Bible dancing. She has strong spiritual beliefs and faith.       Current Outpatient Medications:   ???  chlorhexidine (HIBICLENS) 4 % external liquid, Apply topically daily as needed., Disp: 473 mL, Rfl: 12  ???  ferrous sulfate 325 (65 FE) MG tablet, Take 1 tablet (325 mg total) by mouth in the morning., Disp: 30 tablet, Rfl: 5  ???  ondansetron (ZOFRAN) 8 MG tablet, Take 1 tablet (8 mg total) by mouth two (2) times a day. Take 8 mg twice  a day on days 2, 3, and 4. AND then, 8 mg every 8 hours as needed., Disp: 21 tablet, Rfl: 0  ???  silver sulfaDIAZINE (SSD) 1 % cream, APPLY TOPICALLY TO THE AFFECTED AREA 1 TO 2 TIMES DAILY, Disp: 50 g, Rfl: 1  ???  prochlorperazine (COMPAZINE) 10 MG tablet, Take 1 tablet (10 mg total) by mouth every six (6) hours as needed (Nausea/Vomiting). (Patient not taking: Reported on 10/17/2021), Disp: 30 tablet, Rfl: 0  No Known Allergies    Reproductive History:  Nulliparous. Menarche reported at age 69. LMP No LMP recorded. OCPs no. HRT no.    Review of Systems   10 organ systems reviewed and pertinent as noted in HPI.     Physical Examination:   Blood pressure 132/80, pulse 98, temperature 37.7 ??C (99.9 ??F), temperature source Temporal, resp. rate 17, weight (!) 103.3 kg (227 lb 11.2 oz), SpO2 100 %, not currently breastfeeding.  General Appearance:  No acute distress, well appearing and well nourished.   Head:  Normocephalic, atraumatic.   Eyes:  Conjuctiva and lids appear normal. Pupils equal and round,   sclera anicteric.   Ears:  Overall appearance normal with no scars, lesions or               masses.  Hearing is grossly normal.   Nose: Nares grossly normal, no drainage.   Throat: Lips, mucosa, and tongue normal; teeth and gums normal.   Neck: Supple, symmetrical, trachea midline, no adenopathy;   thyroid:  no enlargement/tenderness/nodules.   Pulmonary:    Normal respiratory effort.  Lungs were clear to auscultation  bilaterally.   Cardiovascular:  Regular rate and rhythm, no murmur noted.  No thrill noted.   Abdomen:   Soft, non-tender, without masses.  No hepatosplenomegaly. No hernias appreciated. Normoactive bowel sounds.   Musculoskeletal: Normal gait.  Extremities without clubbing, cyanosis, or edema.   Skin: Skin color, texture, turgor normal, no rashes or lesions.   Neurologic: No motor abnormalities noted.  Sensation grossly intact.   Lymphatic:  Breast: No cervical or supraclavicular LAD noted.   A comprehensive examination of the breasts and chest wall was performed with the patient upright and supine with arms at her sides and above her head.   Right breast normal in size, normal symmetry, normal contour, no dimpling, no skin changes, nipple everted, nipple exhibits no crusting or excoriation, no masses or nodules, no palpable axillary adenopathy.  Left breast normal in size, normal symmetry, normal contour, no dimpling, no skin changes, nipple everted, nipple exhibits no crusting or excoriation, no masses or nodules, no palpable axillary adenopathy.   Psychiatric: Judgement and insight appropriate.  Oriented to person, place, and time.       Right breast 10/17/2021      Diagnostic Studies:   I personally reviewed all diagnostic imaging, including diagnostic mammogram with spot compression/magnification views, tomosynthesis, ultrasound and MRI.    Bilateral breast MRI, performed on 10/14/2021 at [facility], reveals Postsurgical changes are again noted in the right breast. The previously described enhancing mass that is biopsy-proven recurrent carcinoma within the inner central right breast at posterior depth is much less apparent today with only subtle non-mass enhancement spanning approximately 3.5 cm x 2.9 cm x 2.4 cm remaining. The non-mass enhancement abuts the pectoralis muscle. There is signal void from biopsy marker clip within the non-mass enhancement. No right axillary lymphadenopathy.  There are no suspicious enhancing masses or areas of non-mass enhancement in left breast. There are  multiple prominent left axillary lymph nodes could be reactive in the setting of adjacent skin focus mentioned below. This prominence is new compared with 06/28/2021.  The left aspect of the sternum is enhancing. Comparing with the prior 03/11/2015 breast MRI, at the time of the patient's initial diagnosis, the sternum was enhancing at that time. On the current exam as well as the more recent 06/28/2021 breast MRI the left aspect of the sternum is enhancing, possibly now asymmetric due to radiation of the right aspect of the sternum as part of the patient's initial breast cancer treatment. Prior 06/30/2021 bone scan demonstrated asymmetric uptake in the sternum of uncertain significance with no CT correlate and recommended attention and follow-up.    Diagnostic ultrasound, performed on 10/07/2021 at [facility], reveals At 3:00 6 cm from the nipple, there is a biopsy-proven malignant irregular avascular mass measuring 1.2 x 1.5 x 0.9 cm (previously 2.4 x 1.9 x 2 cm on 06/15/2021). Given the significant interval decrease in size, the clip is now approximately 1.7 cm away from the mass.    Diagnostic mammography, performed on 10/07/2021 at [facility], reveals postlumpectomy changes in the right breast at 3:00. There is focal asymmetry with an associated coil clip in the region of the postlumpectomy changes where a mass was previously noted on 06/15/2021. There is a bowtie clip in the upper outer aspect of the right breast without associated mammographic finding at the site of prior benign biopsy.    Biopsy/Pathology Review:  Ultrasound guided core needle biopsy was performed on 06/16/2021 at [facility] of the lesion of the right breast, which revealed infiltrating ductal carcinoma, Nottingham grade 3, estrogen receptor negative, progesterone receptor negative and H2N negative (1+).    I have personally reviewed the breast imaging, laboratory values, existing medical records, and pathology. I have summarized these findings and my interpretation in the oncology history and assessment above.    Assessment/Plan  Ms. Thorman is a 36 y.o. female who was previously diagnosed with a recurrent right breast infiltrating ductal carcinoma.  Specifically,   Cancer Staging   Malignant neoplasm of right breast in female, estrogen receptor negative (CMS-HCC)  Staging form: Breast, AJCC 7th Edition  - Clinical stage from 03/17/2015: Stage IIB (T2, N1, M0) - Signed by Talbert Cage, DO on 03/17/2015  - Pathologic stage from 09/01/2015: Stage IIA (yT1b, N1a, cM0) - Signed by Talbert Cage, DO on 09/01/2015  Staging form: Breast, AJCC 8th Edition  - Clinical stage from 06/27/2021: Stage IIB (rcT2, cN0, cM0, G3, ER-, PR-, HER2-) - Signed by Rudie Meyer, ANP on 06/27/2021    She underwent neoadjuvant systemic therapy and had a good clinical response. After reviewing all available records and imaging, we had a frank and thorough discussion regarding her course of care. I recommend  undergo mastectomy. The risks and benefits of this treatment plan were discussed in detail.    We specifically discussed:  That she will continue applying silvadene to her wound. She will apply warm compressions and allow for drainage.   She was prescribed silvadene hibiclens to apply to area of drainage.  She was prescribed antibiotics to use if she starts experiencing redness or fevers. She will continue applying silvadene.  The post chemotherapy breast imaging and how this affects surgical planning.  That, at this point in therapy, I would recommend mastectomy, given her recurrence.  That she will omit further lymph node procedures given she underwent SLNB in her previous cancer diagnosis in 2017.  The importance  of negative margins and that positive margins can necessitate a return to the operating room.  The importance of radiation and how its effect on recurrence risk and cosmetic results.  That she is a candidate for total or simple mastectomy mastectomy. At this time, she is not interested in reconstruction. She possibly desires reconstruction in a delayed fashion. She will let us know.  The risks of mastectomy include but are not limited to bleeding, infection, inadequate surgical margins, unsightly scar, chronic pain, collateral damage and imponderables to include, but not limited to disability or death.  That she will be scheduled for right total mastectomy with partial removal of underlying pectoralis muscle, tentatively on November 16th. Consent obtained and orders placed in EPIC.    All of the patient's and family's questions and concerns were addressed during the visit and they are in agreement with the plan of care. Please do not hesitate to contact me with questions or concerns.     Cancer Staging   Malignant neoplasm of right breast in female, estrogen receptor negative (CMS-HCC)  Staging form: Breast, AJCC 7th Edition  - Clinical stage from 03/17/2015: Stage IIB (T2, N1, M0) - Signed by Talbert Cage, DO on 03/17/2015  - Pathologic stage from 09/01/2015: Stage IIA (yT1b, N1a, cM0) - Signed by Talbert Cage, DO on 09/01/2015  Staging form: Breast, AJCC 8th Edition  - Clinical stage from 06/27/2021: Stage IIB (rcT2, cN0, cM0, G3, ER-, PR-, HER2-) - Signed by Rudie Meyer, ANP on 06/27/2021    Scribe's Attestation: Sherilyn Cooter, DO  obtained and performed the history, physical exam and medical decision making elements that were  entered into the chart. Documentation assistance was provided by me personally, a scribe. Signed by Jolene Schimke Scribe, on October 17, 2021 at 9:58 AM.     {*** NOTE TO PROVIDER: PLEASE ADD ATTESTATION NOTING YOU AGREE WITH SCRIBE DOCUMENTATION. ***}    Royann Shivers, DO  Surgical Breast Oncology and Oncoplastics  10/17/2021  10:18 AM

## 2021-10-17 NOTE — Unmapped (Addendum)
It was a pleasure to see you today in the [facility]. Please call my nurse navigator, Alinda Dooms, if you have any interval questions or concerns.     Apply warm compression to area of breast and allow drainage. You were prescribed antibiotics to use if you start experiencing redness or fevers. Continue to apply silvadene. I will prescribe silvadene hibiclens to apply to area of drainage.  Below, you will find details pertaining to your procedure.  Please note that surgical time is not finalized until the day before surgery. The OR staff will call you to finalize this.    You are scheduled for a right simple or total mastectomy (removing the breast only), tentatively on Thursday, November 16th. This date is always subject to change, so make sure we have a good working phone number to contact you in case this date changes. The OR staff will contact you by 4pm the day before your surgery date to let you know your expected surgery time. Be sure to bring a book or something to keep you occupied during the day as the surgery time may be earlier or later than the expected time. The pre-care clinic number is [phone] if you have any questions.    Based on a new federal law, you will be provided with your pathology and/or radiology results once they are finalized. Meaning, you will likely receive these results before I have had a chance to review them independently. We will schedule a follow up appointment with you about a week to two weeks after your surgery to go over the pathology result with you in person. If you feel that you may not understand or be worried or confused by your results, we ask you please not review them until your follow up clinic appointment.      What to expect after your mastectomy:  We recognize that everyone has their own unique tolerances and circumstances.  Therefore, there are no cookie-cutter hospital experiences.  However, we do hope that this information is helpful and gives you some insight into what you can expect.    Pain Control  1. You will be given oral and intravenous (IV) pain medications.  On the first morning after your surgery, you will be switched over to oral pain medication, such as Percocet and will be given a prescription for this medication to fill on your way home.    Activity  1. You are expected to get out of bed and walk around on the day that you have surgery.  This is to prevent getting blood clots in your legs and to help your lungs stay fully expanded after surgery.  In addition, this is to prepare you to return home and to be able to do basic care activities for yourself.  2. You will be given an incentive spirometer (IS).  Use of this device is a very important part of your post-surgical care even when you go home.  You suck in on this device to raise the white bar in the center of the device as high as you can get it.  You should take 2-3 good breaths on the IS every 10-15 minutes.  If you are watching television it helps to do 2 deep breaths on the IS for every commercial that comes on.  This helps to prevent fevers that can result from small areas in your lungs collapsing from not taking deep breaths.  If unchecked, this can progress to pneumonia.  Drains  1. Most people have drains when they come out of surgery and you will go home with them.  The nurses will show you how to take care of them.  You can shower with them and pat the area dry afterwards.  You will also be given antibiotics to take as long as the drains are in place.       Eating  1. We order a solid diet for you after surgery.  However, not everyone can tolerate eating or even drinking after surgery.  For that reason, we give you IV fluids after surgery to prevent dehydration.  In addition, to reduce nausea and vomiting after surgery, we give you medications before and during surgery.  If you are hungry after surgery, please inform your nurse if you would like something to eat if it has not already been provided.    Home Medications  1. We restart home medications as soon as it is safe following surgery, such as blood pressure medications and diabetic medications.  2. Please bring your home medications with you because not all of your medications are commonly available in the hospital.  That would then allow you to take your normal medications without interruption.  We will review your medications and let you know which ones are not available in the hospital and that you should take.    Length of Stay  1. Expect to stay with [place] one night. (pain control, tolerating diet, urinating, etc).      If you have any questions or need anything from the surgical oncology department, here are some additional numbers.    Surgical oncology contact information:  For appointments, please call [phone].     Nurse Navigator available Monday through Friday 8:30 am to 4:30 pm:   Alinda Dooms, RN:    Phone: [phone]   Fax: [phone] attention Alinda Dooms    For emergencies, evenings or weekends, please call [phone] and ask for the surgical oncology resident on call. Please be aware that this person is also responding to in-hospital emergencies and patient issues and may not answer your phone call immediately, but will return your call as soon as possible.

## 2021-10-17 NOTE — Unmapped (Unsigned)
Per chart note from 10/06, patient has completed therapy and patient will not need another refill for Nivestym. Will forward information to pharmacist.

## 2021-10-18 NOTE — Unmapped (Signed)
Specialty Medication(s): Nivestym    Ms.Salone has been dis-enrolled from the [facility] specialty pharmacy services due to therapy completion - expected therapy completion date: 10/06 .    Additional information provided to the patient: per chart notes and drug discontinued in [place]    Keshonda Monsour Vangie Bicker, PharmD  [facility] Specialty Pharmacist

## 2021-11-17 ENCOUNTER — Ambulatory Visit: Admit: 2021-11-17 | Discharge: 2021-11-17 | Payer: PRIVATE HEALTH INSURANCE

## 2021-11-17 ENCOUNTER — Encounter
Admit: 2021-11-17 | Discharge: 2021-11-17 | Payer: PRIVATE HEALTH INSURANCE | Attending: Anesthesiology | Primary: Anesthesiology

## 2021-11-17 MED ORDER — TRAMADOL 50 MG TABLET
ORAL_TABLET | Freq: Four times a day (QID) | ORAL | 0 refills | 3 days | Status: CP
Start: 2021-11-17 — End: 2021-11-22

## 2021-11-17 MED ORDER — ACETAMINOPHEN 500 MG TABLET
ORAL_TABLET | Freq: Four times a day (QID) | ORAL | 0 refills | 4 days | Status: CP | PRN
Start: 2021-11-17 — End: ?

## 2021-11-17 MED ORDER — ONDANSETRON 4 MG DISINTEGRATING TABLET
ORAL_TABLET | Freq: Three times a day (TID) | ORAL | 0 refills | 5 days | Status: CP | PRN
Start: 2021-11-17 — End: 2021-11-24

## 2021-11-28 ENCOUNTER — Ambulatory Visit: Admit: 2021-11-28 | Discharge: 2021-11-29 | Payer: PRIVATE HEALTH INSURANCE

## 2021-11-28 DIAGNOSIS — C50911 Malignant neoplasm of unspecified site of right female breast: Principal | ICD-10-CM

## 2021-11-28 DIAGNOSIS — Z171 Estrogen receptor negative status [ER-]: Principal | ICD-10-CM

## 2021-12-02 ENCOUNTER — Ambulatory Visit
Admit: 2021-12-02 | Discharge: 2021-12-07 | Disposition: A | Discharge status: Home / Self Care | Payer: PRIVATE HEALTH INSURANCE

## 2021-12-02 ENCOUNTER — Ambulatory Visit: Admit: 2021-12-02 | Discharge: 2021-12-07 | Payer: PRIVATE HEALTH INSURANCE

## 2021-12-05 MED ORDER — GABAPENTIN 100 MG CAPSULE
ORAL_CAPSULE | Freq: Three times a day (TID) | ORAL | 0 refills | 5.00000 days
Start: 2021-12-05 — End: 2022-01-04

## 2021-12-05 MED ORDER — ONDANSETRON HCL 8 MG TABLET
ORAL_TABLET | Freq: Three times a day (TID) | ORAL | 0 refills | 3.00000 days | PRN
Start: 2021-12-05 — End: 2021-12-12

## 2021-12-05 MED ORDER — AMOXICILLIN 875 MG-POTASSIUM CLAVULANATE 125 MG TABLET
ORAL_TABLET | Freq: Two times a day (BID) | ORAL | 0 refills | 10.00000 days
Start: 2021-12-05 — End: 2021-12-15

## 2021-12-05 MED ORDER — METHOCARBAMOL 500 MG TABLET
ORAL_TABLET | Freq: Four times a day (QID) | ORAL | 0 refills | 4.00000 days
Start: 2021-12-05 — End: ?

## 2021-12-06 MED ORDER — ONDANSETRON 4 MG DISINTEGRATING TABLET
ORAL_TABLET | Freq: Three times a day (TID) | ORAL | 0 refills | 5.00000 days | Status: CP | PRN
Start: 2021-12-06 — End: 2021-12-13
  Filled 2021-12-06: qty 15, 5d supply, fill #0
  Filled 2021-12-06: qty 20, 7d supply, fill #0

## 2021-12-06 MED ORDER — METHOCARBAMOL 500 MG TABLET
ORAL_TABLET | Freq: Four times a day (QID) | ORAL | 0 refills | 4.00000 days | Status: CP
Start: 2021-12-06 — End: ?
  Filled 2021-12-06: qty 14, 4d supply, fill #0

## 2021-12-06 MED ORDER — ONDANSETRON HCL 8 MG TABLET
ORAL_TABLET | Freq: Three times a day (TID) | ORAL | 0 refills | 7.00000 days | Status: CP | PRN
Start: 2021-12-06 — End: 2021-12-13

## 2021-12-06 MED ORDER — AMOXICILLIN 875 MG-POTASSIUM CLAVULANATE 125 MG TABLET
ORAL_TABLET | Freq: Two times a day (BID) | ORAL | 0 refills | 10.00000 days | Status: CP
Start: 2021-12-06 — End: 2021-12-16
  Filled 2021-12-06: qty 20, 10d supply, fill #0

## 2021-12-06 MED ORDER — GABAPENTIN 100 MG CAPSULE
ORAL_CAPSULE | Freq: Three times a day (TID) | ORAL | 0 refills | 5.00000 days | Status: CP
Start: 2021-12-06 — End: 2022-01-05
  Filled 2021-12-06: qty 14, 5d supply, fill #0

## 2021-12-12 ENCOUNTER — Ambulatory Visit
Admit: 2021-12-12 | Discharge: 2021-12-13 | Payer: PRIVATE HEALTH INSURANCE | Attending: Critical Care Medicine | Primary: Critical Care Medicine

## 2021-12-12 DIAGNOSIS — C50911 Malignant neoplasm of unspecified site of right female breast: Principal | ICD-10-CM

## 2021-12-12 DIAGNOSIS — T148XXA Other injury of unspecified body region, initial encounter: Principal | ICD-10-CM

## 2021-12-12 DIAGNOSIS — L089 Local infection of the skin and subcutaneous tissue, unspecified: Principal | ICD-10-CM

## 2021-12-12 DIAGNOSIS — Z171 Estrogen receptor negative status [ER-]: Principal | ICD-10-CM

## 2021-12-14 ENCOUNTER — Ambulatory Visit: Admit: 2021-12-14 | Discharge: 2021-12-15 | Payer: PRIVATE HEALTH INSURANCE

## 2021-12-14 DIAGNOSIS — C50911 Malignant neoplasm of unspecified site of right female breast: Principal | ICD-10-CM

## 2021-12-14 DIAGNOSIS — Z171 Estrogen receptor negative status [ER-]: Principal | ICD-10-CM

## 2021-12-22 ENCOUNTER — Ambulatory Visit
Admit: 2021-12-22 | Discharge: 2021-12-23 | Payer: PRIVATE HEALTH INSURANCE | Attending: Adult Health | Primary: Adult Health

## 2021-12-22 DIAGNOSIS — Z171 Estrogen receptor negative status [ER-]: Principal | ICD-10-CM

## 2021-12-22 DIAGNOSIS — C50911 Malignant neoplasm of unspecified site of right female breast: Principal | ICD-10-CM

## 2021-12-22 MED ORDER — FLUCONAZOLE 100 MG TABLET
ORAL_TABLET | Freq: Every day | ORAL | 0 refills | 5 days | Status: CP
Start: 2021-12-22 — End: 2021-12-27

## 2021-12-29 ENCOUNTER — Ambulatory Visit
Admit: 2021-12-29 | Discharge: 2021-12-30 | Disposition: A | Discharge status: Home / Self Care | Payer: PRIVATE HEALTH INSURANCE

## 2022-01-03 DIAGNOSIS — R112 Nausea with vomiting, unspecified: Principal | ICD-10-CM

## 2022-01-03 MED ORDER — ONDANSETRON HCL 4 MG TABLET
ORAL_TABLET | Freq: Three times a day (TID) | ORAL | 0 refills | 7 days | Status: CP | PRN
Start: 2022-01-03 — End: 2022-01-10

## 2022-01-06 DIAGNOSIS — R112 Nausea with vomiting, unspecified: Principal | ICD-10-CM

## 2022-01-06 DIAGNOSIS — R630 Anorexia: Principal | ICD-10-CM

## 2022-01-06 DIAGNOSIS — C50911 Malignant neoplasm of unspecified site of right female breast: Principal | ICD-10-CM

## 2022-01-06 DIAGNOSIS — Z171 Estrogen receptor negative status [ER-]: Principal | ICD-10-CM

## 2022-01-07 DIAGNOSIS — C50911 Malignant neoplasm of unspecified site of right female breast: Principal | ICD-10-CM

## 2022-01-07 DIAGNOSIS — Z171 Estrogen receptor negative status [ER-]: Principal | ICD-10-CM

## 2022-01-07 DIAGNOSIS — R1110 Vomiting, unspecified: Principal | ICD-10-CM

## 2022-01-07 MED ORDER — PROMETHAZINE 25 MG RECTAL SUPPOSITORY
Freq: Two times a day (BID) | RECTAL | 1 refills | 2 days | Status: CP | PRN
Start: 2022-01-07 — End: 2023-01-07

## 2022-01-07 MED ORDER — PROMETHAZINE 25 MG TABLET
ORAL_TABLET | Freq: Four times a day (QID) | ORAL | 0 refills | 6 days | Status: CP | PRN
Start: 2022-01-07 — End: 2022-01-14
  Filled 2022-01-16: qty 21, 6d supply, fill #0

## 2022-01-09 ENCOUNTER — Ambulatory Visit: Admit: 2022-01-09 | Discharge: 2022-01-10 | Payer: PRIVATE HEALTH INSURANCE

## 2022-01-09 DIAGNOSIS — R630 Anorexia: Principal | ICD-10-CM

## 2022-01-09 DIAGNOSIS — Z171 Estrogen receptor negative status [ER-]: Principal | ICD-10-CM

## 2022-01-09 DIAGNOSIS — R112 Nausea with vomiting, unspecified: Principal | ICD-10-CM

## 2022-01-09 DIAGNOSIS — C50911 Malignant neoplasm of unspecified site of right female breast: Principal | ICD-10-CM

## 2022-01-10 ENCOUNTER — Ambulatory Visit
Admit: 2022-01-10 | Discharge: 2022-01-16 | Disposition: A | Discharge status: Home / Self Care | Payer: PRIVATE HEALTH INSURANCE | Source: Ambulatory Visit | Admitting: Student in an Organized Health Care Education/Training Program

## 2022-01-10 ENCOUNTER — Ambulatory Visit: Admit: 2022-01-10 | Discharge: 2022-01-16 | Payer: PRIVATE HEALTH INSURANCE

## 2022-01-10 ENCOUNTER — Encounter
Admit: 2022-01-10 | Discharge: 2022-01-16 | Payer: PRIVATE HEALTH INSURANCE | Attending: Student in an Organized Health Care Education/Training Program

## 2022-01-10 ENCOUNTER — Encounter: Admit: 2022-01-10 | Discharge: 2022-01-16 | Payer: PRIVATE HEALTH INSURANCE

## 2022-01-11 ENCOUNTER — Ambulatory Visit: Payer: Self-pay | Admitting: Gastroenterology

## 2022-01-16 MED ORDER — PANTOPRAZOLE 40 MG TABLET,DELAYED RELEASE
ORAL_TABLET | Freq: Every day | ORAL | 0 refills | 30 days | Status: CP
Start: 2022-01-16 — End: 2022-02-15
  Filled 2022-01-16: qty 30, 30d supply, fill #0

## 2022-01-16 MED ORDER — PROMETHAZINE 25 MG TABLET
ORAL_TABLET | Freq: Four times a day (QID) | ORAL | 0 refills | 6.00000 days | Status: CN | PRN
Start: 2022-01-16 — End: 2022-01-23

## 2022-01-17 MED ORDER — PREDNISONE 20 MG TABLET
ORAL_TABLET | ORAL | 0 refills | 15 days | Status: CP
Start: 2022-01-17 — End: 2022-02-01
  Filled 2022-01-16: qty 23, 15d supply, fill #0

## 2022-01-27 DIAGNOSIS — C50911 Malignant neoplasm of unspecified site of right female breast: Principal | ICD-10-CM

## 2022-01-27 DIAGNOSIS — Z171 Estrogen receptor negative status [ER-]: Principal | ICD-10-CM

## 2022-03-03 ENCOUNTER — Ambulatory Visit: Admit: 2022-03-03 | Discharge: 2022-03-04 | Payer: PRIVATE HEALTH INSURANCE

## 2022-03-03 ENCOUNTER — Institutional Professional Consult (permissible substitution): Admit: 2022-03-03 | Discharge: 2022-03-04 | Payer: PRIVATE HEALTH INSURANCE

## 2022-03-03 DIAGNOSIS — Z171 Estrogen receptor negative status [ER-]: Principal | ICD-10-CM

## 2022-03-03 DIAGNOSIS — C50911 Malignant neoplasm of unspecified site of right female breast: Principal | ICD-10-CM

## 2022-03-03 DIAGNOSIS — C50011 Malignant neoplasm of nipple and areola, right female breast: Principal | ICD-10-CM

## 2022-03-05 MED ORDER — CAPECITABINE 500 MG TABLET
ORAL_TABLET | ORAL | 8 refills | 0 days | Status: CP
Start: 2022-03-05 — End: 2022-07-09

## 2022-03-06 DIAGNOSIS — Z171 Estrogen receptor negative status [ER-]: Principal | ICD-10-CM

## 2022-03-06 DIAGNOSIS — C50911 Malignant neoplasm of unspecified site of right female breast: Principal | ICD-10-CM

## 2022-03-06 MED ORDER — CAPECITABINE 500 MG TABLET
ORAL_TABLET | ORAL | 7 refills | 0 days | Status: CP
Start: 2022-03-06 — End: 2022-07-10

## 2022-03-07 ENCOUNTER — Telehealth: Admit: 2022-03-07 | Discharge: 2022-03-08 | Payer: PRIVATE HEALTH INSURANCE

## 2022-03-07 DIAGNOSIS — H1032 Unspecified acute conjunctivitis, left eye: Principal | ICD-10-CM

## 2022-03-07 MED ORDER — POLYMYXIN B SULFATE 10,000 UNIT-TRIMETHOPRIM 1 MG/ML EYE DROPS
OPHTHALMIC | 0 refills | 10 days | Status: CP
Start: 2022-03-07 — End: 2022-03-14

## 2022-03-28 ENCOUNTER — Ambulatory Visit: Admit: 2022-03-28 | Discharge: 2022-03-28 | Payer: PRIVATE HEALTH INSURANCE

## 2022-03-28 ENCOUNTER — Other Ambulatory Visit: Admit: 2022-03-28 | Discharge: 2022-03-28 | Payer: PRIVATE HEALTH INSURANCE

## 2022-03-28 DIAGNOSIS — D709 Neutropenia, unspecified: Principal | ICD-10-CM

## 2022-03-28 DIAGNOSIS — Z171 Estrogen receptor negative status [ER-]: Principal | ICD-10-CM

## 2022-03-28 DIAGNOSIS — C50911 Malignant neoplasm of unspecified site of right female breast: Principal | ICD-10-CM

## 2022-04-20 DIAGNOSIS — C50911 Malignant neoplasm of unspecified site of right female breast: Principal | ICD-10-CM

## 2022-04-20 DIAGNOSIS — D709 Neutropenia, unspecified: Principal | ICD-10-CM

## 2022-04-20 DIAGNOSIS — Z171 Estrogen receptor negative status [ER-]: Principal | ICD-10-CM

## 2022-04-26 DIAGNOSIS — D649 Anemia, unspecified: Principal | ICD-10-CM

## 2022-04-26 DIAGNOSIS — C50911 Malignant neoplasm of unspecified site of right female breast: Principal | ICD-10-CM

## 2022-04-26 DIAGNOSIS — Z171 Estrogen receptor negative status [ER-]: Principal | ICD-10-CM

## 2022-05-12 ENCOUNTER — Ambulatory Visit: Admit: 2022-05-12 | Discharge: 2022-05-13 | Payer: PRIVATE HEALTH INSURANCE

## 2022-05-12 ENCOUNTER — Institutional Professional Consult (permissible substitution): Admit: 2022-05-12 | Discharge: 2022-05-13 | Payer: PRIVATE HEALTH INSURANCE

## 2022-05-12 DIAGNOSIS — D709 Neutropenia, unspecified: Principal | ICD-10-CM

## 2022-05-12 DIAGNOSIS — D649 Anemia, unspecified: Principal | ICD-10-CM

## 2022-05-12 DIAGNOSIS — C50911 Malignant neoplasm of unspecified site of right female breast: Principal | ICD-10-CM

## 2022-05-12 DIAGNOSIS — Z171 Estrogen receptor negative status [ER-]: Principal | ICD-10-CM

## 2022-06-09 ENCOUNTER — Ambulatory Visit
Admit: 2022-06-09 | Discharge: 2022-06-09 | Payer: PRIVATE HEALTH INSURANCE | Attending: Adult Health | Primary: Adult Health

## 2022-06-09 ENCOUNTER — Ambulatory Visit: Admit: 2022-06-09 | Discharge: 2022-06-09 | Payer: PRIVATE HEALTH INSURANCE

## 2022-06-09 DIAGNOSIS — C50911 Malignant neoplasm of unspecified site of right female breast: Principal | ICD-10-CM

## 2022-06-09 DIAGNOSIS — Z1231 Encounter for screening mammogram for malignant neoplasm of breast: Principal | ICD-10-CM

## 2022-06-09 DIAGNOSIS — Z171 Estrogen receptor negative status [ER-]: Principal | ICD-10-CM

## 2022-07-07 ENCOUNTER — Ambulatory Visit
Admit: 2022-07-07 | Discharge: 2022-07-07 | Payer: PRIVATE HEALTH INSURANCE | Attending: Medical Oncology | Primary: Medical Oncology

## 2022-07-07 ENCOUNTER — Institutional Professional Consult (permissible substitution): Admit: 2022-07-07 | Discharge: 2022-07-07 | Payer: PRIVATE HEALTH INSURANCE

## 2022-07-07 DIAGNOSIS — Z171 Estrogen receptor negative status [ER-]: Principal | ICD-10-CM

## 2022-07-07 DIAGNOSIS — C50911 Malignant neoplasm of unspecified site of right female breast: Principal | ICD-10-CM

## 2022-07-07 DIAGNOSIS — D709 Neutropenia, unspecified: Principal | ICD-10-CM

## 2022-07-07 DIAGNOSIS — C50011 Malignant neoplasm of nipple and areola, right female breast: Principal | ICD-10-CM

## 2022-07-07 MED ORDER — FERROUS SULFATE 325 MG (65 MG IRON) TABLET,DELAYED RELEASE
ORAL_TABLET | Freq: Two times a day (BID) | ORAL | 3 refills | 30 days | Status: CP
Start: 2022-07-07 — End: 2023-07-07

## 2022-07-07 MED ORDER — SILVER SULFADIAZINE 1 % TOPICAL CREAM
1 refills | 0 days | Status: CP
Start: 2022-07-07 — End: 2023-07-07

## 2022-09-05 ENCOUNTER — Ambulatory Visit: Admit: 2022-09-05 | Discharge: 2022-09-06 | Payer: PRIVATE HEALTH INSURANCE

## 2022-09-05 DIAGNOSIS — K2970 Gastritis, unspecified, without bleeding: Principal | ICD-10-CM

## 2022-10-20 ENCOUNTER — Institutional Professional Consult (permissible substitution): Admit: 2022-10-20 | Discharge: 2022-10-21 | Payer: PRIVATE HEALTH INSURANCE

## 2022-10-20 ENCOUNTER — Ambulatory Visit
Admit: 2022-10-20 | Discharge: 2022-10-21 | Payer: PRIVATE HEALTH INSURANCE | Attending: Medical Oncology | Primary: Medical Oncology

## 2022-10-20 DIAGNOSIS — C50011 Malignant neoplasm of nipple and areola, right female breast: Principal | ICD-10-CM

## 2022-10-20 DIAGNOSIS — Z171 Estrogen receptor negative status [ER-]: Principal | ICD-10-CM

## 2022-10-20 DIAGNOSIS — C50911 Malignant neoplasm of unspecified site of right female breast: Principal | ICD-10-CM

## 2022-10-30 ENCOUNTER — Telehealth: Admit: 2022-10-30 | Discharge: 2022-10-31 | Payer: PRIVATE HEALTH INSURANCE

## 2022-10-30 DIAGNOSIS — H1032 Unspecified acute conjunctivitis, left eye: Principal | ICD-10-CM

## 2022-10-30 MED ORDER — POLYMYXIN B SULFATE 10,000 UNIT-TRIMETHOPRIM 1 MG/ML EYE DROPS
Freq: Four times a day (QID) | OPHTHALMIC | 0 refills | 5 days | Status: CP
Start: 2022-10-30 — End: 2022-11-04

## 2023-01-26 DIAGNOSIS — Z171 Estrogen receptor negative status [ER-]: Principal | ICD-10-CM

## 2023-01-26 DIAGNOSIS — C50911 Malignant neoplasm of unspecified site of right female breast: Principal | ICD-10-CM

## 2023-01-31 DIAGNOSIS — Z171 Estrogen receptor negative status [ER-]: Principal | ICD-10-CM

## 2023-01-31 DIAGNOSIS — C50911 Malignant neoplasm of unspecified site of right female breast: Principal | ICD-10-CM

## 2023-02-02 ENCOUNTER — Ambulatory Visit: Admit: 2023-02-02 | Discharge: 2023-02-03 | Payer: PRIVATE HEALTH INSURANCE

## 2023-02-02 ENCOUNTER — Ambulatory Visit
Admit: 2023-02-02 | Discharge: 2023-02-03 | Payer: PRIVATE HEALTH INSURANCE | Attending: Medical Oncology | Primary: Medical Oncology

## 2023-02-02 DIAGNOSIS — C50911 Malignant neoplasm of unspecified site of right female breast: Principal | ICD-10-CM

## 2023-02-02 DIAGNOSIS — Z171 Estrogen receptor negative status [ER-]: Principal | ICD-10-CM

## 2023-02-02 DIAGNOSIS — C50011 Malignant neoplasm of nipple and areola, right female breast: Principal | ICD-10-CM

## 2023-02-02 MED ORDER — FERROUS SULFATE 325 MG (65 MG IRON) TABLET,DELAYED RELEASE
ORAL_TABLET | Freq: Two times a day (BID) | ORAL | 3 refills | 30.00 days | Status: CP
Start: 2023-02-02 — End: 2024-02-02

## 2023-02-03 DIAGNOSIS — Z171 Estrogen receptor negative status [ER-]: Principal | ICD-10-CM

## 2023-02-03 DIAGNOSIS — C50011 Malignant neoplasm of nipple and areola, right female breast: Principal | ICD-10-CM

## 2023-02-03 DIAGNOSIS — C50911 Malignant neoplasm of unspecified site of right female breast: Principal | ICD-10-CM

## 2023-02-26 ENCOUNTER — Inpatient Hospital Stay: Admit: 2023-02-26 | Discharge: 2023-02-27 | Payer: PRIVATE HEALTH INSURANCE

## 2023-03-15 DIAGNOSIS — C50011 Malignant neoplasm of nipple and areola, right female breast: Principal | ICD-10-CM

## 2023-03-15 DIAGNOSIS — C50911 Malignant neoplasm of unspecified site of right female breast: Principal | ICD-10-CM

## 2023-03-15 DIAGNOSIS — Z171 Estrogen receptor negative status [ER-]: Principal | ICD-10-CM

## 2023-03-25 DIAGNOSIS — J069 Acute upper respiratory infection, unspecified: Principal | ICD-10-CM

## 2023-03-26 ENCOUNTER — Encounter: Admit: 2023-03-26 | Discharge: 2023-03-26 | Payer: PRIVATE HEALTH INSURANCE

## 2023-06-15 ENCOUNTER — Ambulatory Visit
Admit: 2023-06-15 | Discharge: 2023-06-15 | Payer: Medicaid (Managed Care) | Attending: Adult Health | Primary: Adult Health

## 2023-06-15 ENCOUNTER — Inpatient Hospital Stay: Admit: 2023-06-15 | Discharge: 2023-06-15 | Payer: Medicaid (Managed Care)

## 2023-06-15 DIAGNOSIS — Z1231 Encounter for screening mammogram for malignant neoplasm of breast: Principal | ICD-10-CM

## 2023-06-15 DIAGNOSIS — C50911 Malignant neoplasm of unspecified site of right female breast: Principal | ICD-10-CM

## 2023-06-15 DIAGNOSIS — C50011 Malignant neoplasm of nipple and areola, right female breast: Principal | ICD-10-CM

## 2023-06-15 DIAGNOSIS — Z171 Estrogen receptor negative status [ER-]: Principal | ICD-10-CM

## 2023-06-15 MED ORDER — SILVER SULFADIAZINE 1 % TOPICAL CREAM
TOPICAL | 1 refills | 0.00000 days | Status: CP
Start: 2023-06-15 — End: 2024-06-14

## 2023-06-15 MED ORDER — FERROUS SULFATE 325 MG (65 MG IRON) TABLET,DELAYED RELEASE
ORAL_TABLET | Freq: Two times a day (BID) | ORAL | 3 refills | 90.00000 days | Status: CP
Start: 2023-06-15 — End: 2024-06-14

## 2023-06-21 DIAGNOSIS — Z171 Estrogen receptor negative status [ER-]: Principal | ICD-10-CM

## 2023-06-21 DIAGNOSIS — C50011 Malignant neoplasm of nipple and areola, right female breast: Principal | ICD-10-CM

## 2023-06-21 DIAGNOSIS — C50911 Malignant neoplasm of unspecified site of right female breast: Principal | ICD-10-CM

## 2023-08-15 DIAGNOSIS — Z171 Estrogen receptor negative status [ER-]: Principal | ICD-10-CM

## 2023-08-15 DIAGNOSIS — C50911 Malignant neoplasm of unspecified site of right female breast: Principal | ICD-10-CM

## 2023-08-15 DIAGNOSIS — C50011 Malignant neoplasm of nipple and areola, right female breast: Principal | ICD-10-CM

## 2023-09-28 ENCOUNTER — Ambulatory Visit
Admit: 2023-09-28 | Discharge: 2023-09-29 | Payer: Medicaid (Managed Care) | Attending: Medical Oncology | Primary: Medical Oncology

## 2023-09-28 ENCOUNTER — Ambulatory Visit: Admit: 2023-09-28 | Discharge: 2023-09-29 | Payer: Medicaid (Managed Care)

## 2023-09-28 DIAGNOSIS — C50011 Malignant neoplasm of nipple and areola, right female breast: Principal | ICD-10-CM

## 2023-09-28 DIAGNOSIS — Z171 Estrogen receptor negative status [ER-]: Principal | ICD-10-CM

## 2023-09-28 DIAGNOSIS — C50911 Malignant neoplasm of unspecified site of right female breast: Principal | ICD-10-CM
